# Patient Record
Sex: Male | Born: 1950
Health system: Southern US, Community
[De-identification: ages and names within clinical notes are randomized; demographics above are authoritative.]

## PROBLEM LIST (undated history)

## (undated) DIAGNOSIS — E7800 Pure hypercholesterolemia, unspecified: Secondary | ICD-10-CM

## (undated) DIAGNOSIS — I499 Cardiac arrhythmia, unspecified: Secondary | ICD-10-CM

## (undated) DIAGNOSIS — G4730 Sleep apnea, unspecified: Secondary | ICD-10-CM

## (undated) DIAGNOSIS — I2510 Atherosclerotic heart disease of native coronary artery without angina pectoris: Secondary | ICD-10-CM

## (undated) DIAGNOSIS — I10 Essential (primary) hypertension: Secondary | ICD-10-CM

## (undated) DIAGNOSIS — I219 Acute myocardial infarction, unspecified: Secondary | ICD-10-CM

## (undated) DIAGNOSIS — K56609 Unspecified intestinal obstruction, unspecified as to partial versus complete obstruction: Secondary | ICD-10-CM

## (undated) HISTORY — PX: LAPAROSCOPIC SMALL BOWEL RESECTION: SUR793

---

## 2002-06-29 DIAGNOSIS — I2510 Atherosclerotic heart disease of native coronary artery without angina pectoris: Secondary | ICD-10-CM

## 2002-06-29 HISTORY — DX: Atherosclerotic heart disease of native coronary artery without angina pectoris: I25.10

## 2002-06-29 HISTORY — PX: CORONARY STENT PLACEMENT: SHX1402

## 2005-10-13 ENCOUNTER — Ambulatory Visit: Payer: Self-pay | Admitting: Gastroenterology

## 2009-01-22 ENCOUNTER — Ambulatory Visit: Payer: Self-pay | Admitting: Urology

## 2009-01-24 ENCOUNTER — Ambulatory Visit: Payer: Self-pay | Admitting: Urology

## 2011-11-09 ENCOUNTER — Emergency Department: Payer: Self-pay | Admitting: Emergency Medicine

## 2011-11-09 LAB — COMPREHENSIVE METABOLIC PANEL
Anion Gap: 9 (ref 7–16)
BUN: 10 mg/dL (ref 7–18)
Calcium, Total: 8.3 mg/dL — ABNORMAL LOW (ref 8.5–10.1)
Chloride: 102 mmol/L (ref 98–107)
Co2: 27 mmol/L (ref 21–32)
Creatinine: 0.87 mg/dL (ref 0.60–1.30)
EGFR (African American): >60
EGFR (Non-African Amer.): >60
Glucose: 91 mg/dL (ref 65–99)
Osmolality: 274 (ref 275–301)
Potassium: 4 mmol/L (ref 3.5–5.1)
SGOT(AST): 39 U/L — ABNORMAL HIGH (ref 15–37)
SGPT (ALT): 48 U/L
Total Protein: 7.7 g/dL (ref 6.4–8.2)

## 2011-11-09 LAB — CBC
HCT: 45.8 % (ref 40.0–52.0)
HGB: 15.3 g/dL (ref 13.0–18.0)
MCH: 30.2 pg (ref 26.0–34.0)
MCHC: 33.5 g/dL (ref 32.0–36.0)
MCV: 90 fL (ref 80–100)
Platelet: 223 10*3/uL (ref 150–440)
WBC: 6.7 10*3/uL (ref 3.8–10.6)

## 2011-11-09 LAB — URINALYSIS, COMPLETE
Bacteria: NONE SEEN
Blood: NEGATIVE
Leukocyte Esterase: NEGATIVE
Nitrite: NEGATIVE
Ph: 6 (ref 4.5–8.0)
Specific Gravity: 1.005 (ref 1.003–1.030)
Squamous Epithelial: NONE SEEN

## 2011-11-09 LAB — LIPASE, BLOOD: Lipase: 136 U/L (ref 73–393)

## 2013-11-27 HISTORY — PX: LITHOTRIPSY: SUR834

## 2013-12-06 ENCOUNTER — Ambulatory Visit: Payer: Self-pay | Admitting: Urology

## 2013-12-07 ENCOUNTER — Ambulatory Visit: Payer: Self-pay | Admitting: Urology

## 2014-04-15 ENCOUNTER — Emergency Department: Payer: Self-pay | Admitting: Emergency Medicine

## 2015-06-05 ENCOUNTER — Encounter
Admission: RE | Admit: 2015-06-05 | Discharge: 2015-06-05 | Disposition: A | Discharge status: Home / Self Care | Payer: BC Managed Care – PPO | Source: Ambulatory Visit | Attending: Urology | Admitting: Urology

## 2015-06-05 ENCOUNTER — Encounter: Payer: Self-pay | Admitting: *Deleted

## 2015-06-05 DIAGNOSIS — I2510 Atherosclerotic heart disease of native coronary artery without angina pectoris: Secondary | ICD-10-CM | POA: Diagnosis not present

## 2015-06-05 DIAGNOSIS — Z7982 Long term (current) use of aspirin: Secondary | ICD-10-CM | POA: Diagnosis not present

## 2015-06-05 DIAGNOSIS — Z79899 Other long term (current) drug therapy: Secondary | ICD-10-CM | POA: Diagnosis not present

## 2015-06-05 DIAGNOSIS — N200 Calculus of kidney: Secondary | ICD-10-CM | POA: Diagnosis not present

## 2015-06-05 DIAGNOSIS — G4730 Sleep apnea, unspecified: Secondary | ICD-10-CM | POA: Diagnosis not present

## 2015-06-05 DIAGNOSIS — Z87891 Personal history of nicotine dependence: Secondary | ICD-10-CM | POA: Diagnosis not present

## 2015-06-05 DIAGNOSIS — Z7902 Long term (current) use of antithrombotics/antiplatelets: Secondary | ICD-10-CM | POA: Diagnosis not present

## 2015-06-05 DIAGNOSIS — I10 Essential (primary) hypertension: Secondary | ICD-10-CM | POA: Diagnosis not present

## 2015-06-05 DIAGNOSIS — I252 Old myocardial infarction: Secondary | ICD-10-CM | POA: Diagnosis not present

## 2015-06-05 DIAGNOSIS — Z955 Presence of coronary angioplasty implant and graft: Secondary | ICD-10-CM | POA: Diagnosis not present

## 2015-06-05 DIAGNOSIS — I499 Cardiac arrhythmia, unspecified: Secondary | ICD-10-CM | POA: Diagnosis not present

## 2015-06-06 ENCOUNTER — Ambulatory Visit
Admission: RE | Admit: 2015-06-06 | Discharge: 2015-06-06 | Disposition: A | Discharge status: Home / Self Care | Payer: BC Managed Care – PPO | Source: Ambulatory Visit | Attending: Urology | Admitting: Urology

## 2015-06-06 ENCOUNTER — Encounter
Admission: RE | Disposition: A | Discharge status: Home / Self Care | Payer: Self-pay | Source: Ambulatory Visit | Attending: Urology

## 2015-06-06 ENCOUNTER — Encounter: Payer: Self-pay | Admitting: *Deleted

## 2015-06-06 DIAGNOSIS — N200 Calculus of kidney: Secondary | ICD-10-CM

## 2015-06-06 DIAGNOSIS — Z87891 Personal history of nicotine dependence: Secondary | ICD-10-CM | POA: Insufficient documentation

## 2015-06-06 DIAGNOSIS — Z7902 Long term (current) use of antithrombotics/antiplatelets: Secondary | ICD-10-CM | POA: Insufficient documentation

## 2015-06-06 DIAGNOSIS — I10 Essential (primary) hypertension: Secondary | ICD-10-CM | POA: Insufficient documentation

## 2015-06-06 DIAGNOSIS — Z79899 Other long term (current) drug therapy: Secondary | ICD-10-CM | POA: Insufficient documentation

## 2015-06-06 DIAGNOSIS — Z955 Presence of coronary angioplasty implant and graft: Secondary | ICD-10-CM | POA: Insufficient documentation

## 2015-06-06 DIAGNOSIS — I499 Cardiac arrhythmia, unspecified: Secondary | ICD-10-CM | POA: Insufficient documentation

## 2015-06-06 DIAGNOSIS — G4730 Sleep apnea, unspecified: Secondary | ICD-10-CM | POA: Insufficient documentation

## 2015-06-06 DIAGNOSIS — I2510 Atherosclerotic heart disease of native coronary artery without angina pectoris: Secondary | ICD-10-CM | POA: Insufficient documentation

## 2015-06-06 DIAGNOSIS — Z7982 Long term (current) use of aspirin: Secondary | ICD-10-CM | POA: Insufficient documentation

## 2015-06-06 DIAGNOSIS — I252 Old myocardial infarction: Secondary | ICD-10-CM | POA: Insufficient documentation

## 2015-06-06 HISTORY — DX: Acute myocardial infarction, unspecified: I21.9

## 2015-06-06 HISTORY — DX: Cardiac arrhythmia, unspecified: I49.9

## 2015-06-06 HISTORY — PX: EXTRACORPOREAL SHOCK WAVE LITHOTRIPSY: SHX1557

## 2015-06-06 HISTORY — DX: Essential (primary) hypertension: I10

## 2015-06-06 HISTORY — DX: Atherosclerotic heart disease of native coronary artery without angina pectoris: I25.10

## 2015-06-06 HISTORY — DX: Sleep apnea, unspecified: G47.30

## 2015-06-06 HISTORY — DX: Pure hypercholesterolemia, unspecified: E78.00

## 2015-06-06 SURGERY — LITHOTRIPSY, ESWL
Anesthesia: Moderate Sedation | Laterality: Right

## 2015-06-06 MED ORDER — MORPHINE SULFATE (PF) 10 MG/ML IV SOLN
10.0000 mg | Freq: Once | INTRAVENOUS | Status: AC
  Administered 2015-06-06: 10 mg via INTRAMUSCULAR

## 2015-06-06 MED ORDER — TAMSULOSIN HCL 0.4 MG PO CAPS: 0.4000 mg | ORAL_CAPSULE | Freq: Every day | ORAL | Status: DC

## 2015-06-06 MED ORDER — LEVOFLOXACIN 500 MG PO TABS
500.0000 mg | ORAL_TABLET | ORAL | Status: AC
  Administered 2015-06-06: 500 mg via ORAL

## 2015-06-06 MED ORDER — MIDAZOLAM HCL 2 MG/2ML IJ SOLN
1.0000 mg | Freq: Once | INTRAMUSCULAR | Status: AC
  Administered 2015-06-06: 1 mg via INTRAMUSCULAR

## 2015-06-06 MED ORDER — MIDAZOLAM HCL 2 MG/2ML IJ SOLN
INTRAMUSCULAR | Status: AC
Start: 2015-06-06 — End: 2015-06-06
  Administered 2015-06-06: 1 mg via INTRAMUSCULAR
  Filled 2015-06-06: qty 2

## 2015-06-06 MED ORDER — FUROSEMIDE 10 MG/ML IJ SOLN
10.0000 mg | Freq: Once | INTRAMUSCULAR | Status: AC
  Administered 2015-06-06: 10 mg via INTRAVENOUS

## 2015-06-06 MED ORDER — DIPHENHYDRAMINE HCL 25 MG PO CAPS
ORAL_CAPSULE | ORAL | Status: AC
  Administered 2015-06-06: 25 mg via ORAL
  Filled 2015-06-06: qty 1

## 2015-06-06 MED ORDER — PROMETHAZINE HCL 25 MG/ML IJ SOLN
25.0000 mg | Freq: Once | INTRAMUSCULAR | Status: AC
  Administered 2015-06-06: 25 mg via INTRAMUSCULAR

## 2015-06-06 MED ORDER — PROMETHAZINE HCL 25 MG/ML IJ SOLN: 25.0000 mg | Freq: Once | INTRAMUSCULAR | Status: DC

## 2015-06-06 MED ORDER — LEVOFLOXACIN 500 MG PO TABS
ORAL_TABLET | ORAL | Status: AC
  Administered 2015-06-06: 500 mg via ORAL
  Filled 2015-06-06: qty 1

## 2015-06-06 MED ORDER — NUCYNTA 50 MG PO TABS: 50.0000 mg | ORAL_TABLET | Freq: Four times a day (QID) | ORAL | Status: DC | PRN

## 2015-06-06 MED ORDER — DEXTROSE-NACL 5-0.45 % IV SOLN
INTRAVENOUS | Status: DC
  Administered 2015-06-06: 12:00:00 via INTRAVENOUS

## 2015-06-06 MED ORDER — PROMETHAZINE HCL 25 MG/ML IJ SOLN
INTRAMUSCULAR | Status: AC
  Administered 2015-06-06: 25 mg via INTRAMUSCULAR
  Filled 2015-06-06: qty 1

## 2015-06-06 MED ORDER — DOCUSATE SODIUM 100 MG PO CAPS: 200.0000 mg | ORAL_CAPSULE | Freq: Two times a day (BID) | ORAL | Status: DC

## 2015-06-06 MED ORDER — LEVOFLOXACIN 500 MG PO TABS: 500.0000 mg | ORAL_TABLET | Freq: Every day | ORAL | Status: DC

## 2015-06-06 MED ORDER — MORPHINE SULFATE (PF) 10 MG/ML IV SOLN
INTRAVENOUS | Status: AC
  Administered 2015-06-06: 10 mg via INTRAMUSCULAR
  Filled 2015-06-06: qty 1

## 2015-06-06 MED ORDER — FUROSEMIDE 10 MG/ML IJ SOLN
INTRAMUSCULAR | Status: AC
  Filled 2015-06-06: qty 2

## 2015-06-06 MED ORDER — ONDANSETRON 8 MG PO TBDP: 8.0000 mg | ORAL_TABLET | Freq: Four times a day (QID) | ORAL | Status: DC | PRN

## 2015-06-06 MED ORDER — DIPHENHYDRAMINE HCL 25 MG PO CAPS
25.0000 mg | ORAL_CAPSULE | ORAL | Status: AC
  Administered 2015-06-06: 25 mg via ORAL

## 2015-06-06 NOTE — OR Nursing (Signed)
Post op, no incisions, red circle on right flank 6 cm in diameter.

## 2015-06-06 NOTE — OR Nursing (Signed)
Spoke with Dr. Evelene CroonWolff prior to pt discharge, pt instructed to restart Plavix tomorrow, if pt has an increase in bleeding with urination, pt is to back off on the Plavix.  Pt is not to restart aspirin until he is seen in Dr. Amada JupiterWolff's office on 06/13/15 for follow up.

## 2015-06-06 NOTE — OR Nursing (Signed)
Pt voiced he needed to use the toilet upon arrival to Post-op.  Voided 500 ml blood colored urine,  No traces of stone seen in strainer.  Pt instructed to strain urine and put pieces of stone in a cup and take to Dr. Amada JupiterWolff's office for follow up.

## 2015-06-06 NOTE — Discharge Instructions (Addendum)
Dietary Guidelines to Help Prevent Kidney Stones °Your risk of kidney stones can be decreased by adjusting the foods you eat. The most important thing you can do is drink enough fluid. You should drink enough fluid to keep your urine clear or pale yellow. The following guidelines provide specific information for the type of kidney stone you have had. °GUIDELINES ACCORDING TO TYPE OF KIDNEY STONE °Calcium Oxalate Kidney Stones °· Reduce the amount of salt you eat. Foods that have a lot of salt cause your body to release excess calcium into your urine. The excess calcium can combine with a substance called oxalate to form kidney stones. °· Reduce the amount of animal protein you eat if the amount you eat is excessive. Animal protein causes your body to release excess calcium into your urine. Ask your dietitian how much protein from animal sources you should be eating. °· Avoid foods that are high in oxalates. If you take vitamins, they should have less than 500 mg of vitamin C. Your body turns vitamin C into oxalates. You do not need to avoid fruits and vegetables high in vitamin C. °Calcium Phosphate Kidney Stones °· Reduce the amount of salt you eat to help prevent the release of excess calcium into your urine. °· Reduce the amount of animal protein you eat if the amount you eat is excessive. Animal protein causes your body to release excess calcium into your urine. Ask your dietitian how much protein from animal sources you should be eating. °· Get enough calcium from food or take a calcium supplement (ask your dietitian for recommendations). Food sources of calcium that do not increase your risk of kidney stones include: °· Broccoli. °· Dairy products, such as cheese and yogurt. °· Pudding. °Uric Acid Kidney Stones °· Do not have more than 6 oz of animal protein per day. °FOOD SOURCES °Animal Protein Sources °· Meat (all types). °· Poultry. °· Eggs. °· Fish, seafood. °Foods High in Salt °· Salt seasonings. °· Soy  sauce. °· Teriyaki sauce. °· Cured and processed meats. °· Salted crackers and snack foods. °· Fast food. °· Canned soups and most canned foods. °Foods High in Oxalates °· Grains: °· Amaranth. °· Barley. °· Grits. °· Wheat germ. °· Bran. °· Buckwheat flour. °· All bran cereals. °· Pretzels. °· Whole wheat bread. °· Vegetables: °· Beans (wax). °· Beets and beet greens. °· Collard greens. °· Eggplant. °· Escarole. °· Leeks. °· Okra. °· Parsley. °· Rutabagas. °· Spinach. °· Swiss chard. °· Tomato paste. °· Fried potatoes. °· Sweet potatoes. °· Fruits: °· Red currants. °· Figs. °· Kiwi. °· Rhubarb. °· Meat and Other Protein Sources: °· Beans (dried). °· Soy burgers and other soybean products. °· Miso. °· Nuts (peanuts, almonds, pecans, cashews, hazelnuts). °· Nut butters. °· Sesame seeds and tahini (paste made of sesame seeds). °· Poppy seeds. °· Beverages: °· Chocolate drink mixes. °· Soy milk. °· Instant iced tea. °· Juices made from high-oxalate fruits or vegetables. °· Other: °· Carob. °· Chocolate. °· Fruitcake. °· Marmalades. °  °This information is not intended to replace advice given to you by your health care provider. Make sure you discuss any questions you have with your health care provider. °  °Document Released: 10/10/2010 Document Revised: 06/20/2013 Document Reviewed: 05/12/2013 °Elsevier Interactive Patient Education ©2016 Elsevier Inc. ° °Kidney Stones °Kidney stones (urolithiasis) are deposits that form inside your kidneys. The intense pain is caused by the stone moving through the urinary tract. When the stone moves, the ureter   goes into spasm around the stone. The stone is usually passed in the urine.  °CAUSES  °· A disorder that makes certain neck glands produce too much parathyroid hormone (primary hyperparathyroidism). °· A buildup of uric acid crystals, similar to gout in your joints. °· Narrowing (stricture) of the ureter. °· A kidney obstruction present at birth (congenital  obstruction). °· Previous surgery on the kidney or ureters. °· Numerous kidney infections. °SYMPTOMS  °· Feeling sick to your stomach (nauseous). °· Throwing up (vomiting). °· Blood in the urine (hematuria). °· Pain that usually spreads (radiates) to the groin. °· Frequency or urgency of urination. °DIAGNOSIS  °· Taking a history and physical exam. °· Blood or urine tests. °· CT scan. °· Occasionally, an examination of the inside of the urinary bladder (cystoscopy) is performed. °TREATMENT  °· Observation. °· Increasing your fluid intake. °· Extracorporeal shock wave lithotripsy--This is a noninvasive procedure that uses shock waves to break up kidney stones. °· Surgery may be needed if you have severe pain or persistent obstruction. There are various surgical procedures. Most of the procedures are performed with the use of small instruments. Only small incisions are needed to accommodate these instruments, so recovery time is minimized. °The size, location, and chemical composition are all important variables that will determine the proper choice of action for you. Talk to your health care provider to better understand your situation so that you will minimize the risk of injury to yourself and your kidney.  °HOME CARE INSTRUCTIONS  °· Drink enough water and fluids to keep your urine clear or pale yellow. This will help you to pass the stone or stone fragments. °· Strain all urine through the provided strainer. Keep all particulate matter and stones for your health care provider to see. The stone causing the pain may be as small as a grain of salt. It is very important to use the strainer each and every time you pass your urine. The collection of your stone will allow your health care provider to analyze it and verify that a stone has actually passed. The stone analysis will often identify what you can do to reduce the incidence of recurrences. °· Only take over-the-counter or prescription medicines for pain,  discomfort, or fever as directed by your health care provider. °· Keep all follow-up visits as told by your health care provider. This is important. °· Get follow-up X-rays if required. The absence of pain does not always mean that the stone has passed. It may have only stopped moving. If the urine remains completely obstructed, it can cause loss of kidney function or even complete destruction of the kidney. It is your responsibility to make sure X-rays and follow-ups are completed. Ultrasounds of the kidney can show blockages and the status of the kidney. Ultrasounds are not associated with any radiation and can be performed easily in a matter of minutes. °· Make changes to your daily diet as told by your health care provider. You may be told to: °· Limit the amount of salt that you eat. °· Eat 5 or more servings of fruits and vegetables each day. °· Limit the amount of meat, poultry, fish, and eggs that you eat. °· Collect a 24-hour urine sample as told by your health care provider. You may need to collect another urine sample every 6-12 months. °SEEK MEDICAL CARE IF: °· You experience pain that is progressive and unresponsive to any pain medicine you have been prescribed. °SEEK IMMEDIATE MEDICAL CARE IF:  °· Pain   cannot be controlled with the prescribed medicine. °· You have a fever or shaking chills. °· The severity or intensity of pain increases over 18 hours and is not relieved by pain medicine. °· You develop a new onset of abdominal pain. °· You feel faint or pass out. °· You are unable to urinate. °  °This information is not intended to replace advice given to you by your health care provider. Make sure you discuss any questions you have with your health care provider. °  °Document Released: 06/15/2005 Document Revised: 03/06/2015 Document Reviewed: 11/16/2012 °Elsevier Interactive Patient Education ©2016 Elsevier Inc. ° °Lithotripsy, Care After °Refer to this sheet in the next few weeks. These instructions  provide you with information on caring for yourself after your procedure. Your health care provider may also give you more specific instructions. Your treatment has been planned according to current medical practices, but problems sometimes occur. Call your health care provider if you have any problems or questions after your procedure. °WHAT TO EXPECT AFTER THE PROCEDURE  °· Your urine may have a red tinge for a few days after treatment. Blood loss is usually minimal. °· You may have soreness in the back or flank area. This usually goes away after a few days. The procedure can cause blotches or bruises on the back where the pressure wave enters the skin. These marks usually cause only minimal discomfort and should disappear in a short time. °· Stone fragments should begin to pass within 24 hours of treatment. However, a delayed passage is not unusual. °· You may have pain, discomfort, and feel sick to your stomach (nauseated) when the crushed fragments of stone are passed down the tube from the kidney to the bladder. Stone fragments can pass soon after the procedure and may last for up to 4-8 weeks. °· A small number of patients may have severe pain when stone fragments are not able to pass, which leads to an obstruction. °· If your stone is greater than 1 inch (2.5 cm) in diameter or if you have multiple stones that have a combined diameter greater than 1 inch (2.5 cm), you may require more than one treatment. °· If you had a stent placed prior to your procedure, you may experience some discomfort, especially during urination. You may experience the pain or discomfort in your flank or back, or you may experience a sharp pain or discomfort at the base of your penis or in your lower abdomen. The discomfort usually lasts only a few minutes after urinating. °HOME CARE INSTRUCTIONS  °· Rest at home until you feel your energy improving. °· Only take over-the-counter or prescription medicines for pain, discomfort, or  fever as directed by your health care provider. Depending on the type of lithotripsy, you may need to take antibiotics and anti-inflammatory medicines for a few days. °· Drink enough water and fluids to keep your urine clear or pale yellow. This helps "flush" your kidneys. It helps pass any remaining pieces of stone and prevents stones from coming back. °· Most people can resume daily activities within 1-2 days after standard lithotripsy. It can take longer to recover from laser and percutaneous lithotripsy. °· Strain all urine through the provided strainer. Keep all particulate matter and stones for your health care provider to see. The stone may be as small as a grain of salt. It is very important to use the strainer each and every time you pass your urine. Any stones that are found can be sent to   a medical lab for examination.  Visit your health care provider for a follow-up appointment in a few weeks. Your doctor may remove your stent if you have one. Your health care provider will also check to see whether stone particles still remain. SEEK MEDICAL CARE IF:   Your pain is not relieved by medicine.  You have a lasting nauseous feeling.  You feel there is too much blood in the urine.  You develop persistent problems with frequent or painful urination that does not at least partially improve after 2 days following the procedure.  You have a congested cough.  You feel lightheaded.  You develop a rash or any other signs that might suggest an allergic problem.  You develop any reaction or side effects to your medicine(s). SEEK IMMEDIATE MEDICAL CARE IF:   You experience severe back or flank pain or both.  You see nothing but blood when you urinate.  You cannot pass any urine at all.  You have a fever or shaking chills.  You develop shortness of breath, difficulty breathing, or chest pain.  You develop vomiting that will not stop after 6-8 hours.  You have a fainting episode.   This  information is not intended to replace advice given to you by your health care provider. Make sure you discuss any questions you have with your health care provider.   Document Released: 07/05/2007 Document Revised: 03/06/2015 Document Reviewed: 12/29/2012 Elsevier Interactive Patient Education [employer].  Lithotripsy Lithotripsy is a treatment that can sometimes help eliminate kidney stones and pain that they cause. A form of lithotripsy, also known as extracorporeal shock wave lithotripsy, is a nonsurgical procedure that helps your body rid itself of the kidney stone when it is too big to pass on its own. Extracorporeal shock wave lithotripsy is a method of crushing a kidney stone with shock waves. These shock waves pass through your body and are focused on your stone. They cause the kidney stones to crumble while still in the urinary tract. It is then easier for the smaller pieces of stone to pass in the urine. Lithotripsy usually takes about an hour. It is done in a hospital, a lithotripsy center, or a mobile unit. It usually does not require an overnight stay. Your health care provider will instruct you on preparation for the procedure. Your health care provider will tell you what to expect afterward. LET [facility] CARE PROVIDER KNOW ABOUT:  Any allergies you have.  All medicines you are taking, including vitamins, herbs, eye drops, creams, and over-the-counter medicines.  Previous problems you or members of your family have had with the use of anesthetics.  Any blood disorders you have.  Previous surgeries you have had.  Medical conditions you have. RISKS AND COMPLICATIONS Generally, lithotripsy for kidney stones is a safe procedure. However, as with any procedure, complications can occur. Possible complications include:  Infection.  Bleeding of the kidney.  Bruising of the kidney or skin.  Obstruction of the ureter.  Failure of the stone to fragment. BEFORE THE  PROCEDURE  Do not eat or drink for 6-8 hours prior to the procedure. You may, however, take the medications with a sip of water that your physician instructs you to take  Do not take aspirin or aspirin-containing products for 7 days prior to your procedure  Do not take nonsteroidal anti-inflammatory products for 7 days prior to your procedure PROCEDURE A stent (flexible tube with holes) may be placed in your ureter. The ureter is  the tube that transports the urine from the kidneys to the bladder. Your health care provider may place a stent before the procedure. This will help keep urine flowing from the kidney if the fragments of the stone block the ureter. You may have an IV tube placed in one of your veins to give you fluids and medicines. These medicines may help you relax or make you sleep. During the procedure, you will lie comfortably on a fluid-filled cushion or in a warm-water bath. After an X-ray or ultrasound exam to locate your stone, shock waves are aimed at the stone. If you are awake, you may feel a tapping sensation as the shock waves pass through your body. If large stone particles remain after treatment, a second procedure may be necessary at a later date. °For comfort during the test: °· Relax as much as possible. °· Try to remain still as much as possible. °· Try to follow instructions to speed up the test. °· Let your health care provider know if you are uncomfortable, anxious, or in pain. °AFTER THE PROCEDURE  °After surgery, you will be taken to the recovery area. A nurse will watch and check your progress. Once you're awake, stable, and taking fluids well, you will be allowed to go home as long as there are no problems. You will also be allowed to pass your urine before discharge. You may be given antibiotics to help prevent infection. You may also be prescribed pain medicine if needed. In a week or two, your health care provider may remove your stent, if you have one. You may first  have an X-ray exam to check on how successful the fragmentation of your stone has been and how much of the stone has passed. Your health care provider will check to see whether or not stone particles remain. °SEEK IMMEDIATE MEDICAL CARE IF: °· You develop a fever or shaking chills. °· Your pain is not relieved by medicine. °· You feel sick to your stomach (nauseated) and you vomit. °· You develop heavy bleeding. °· You have difficulty urinating. °· You start to pass your stent from your penis. °  °This information is not intended to replace advice given to you by your health care provider. Make sure you discuss any questions you have with your health care provider. °  °Document Released: 06/12/2000 Document Revised: 07/06/2014 Document Reviewed: 12/29/2012 °Elsevier Interactive Patient Education ©2016 Elsevier Inc. ° °Renal Colic °Renal colic is pain that is caused by passing a kidney stone. The pain can be sharp and severe. It may be felt in the back, abdomen, side (flank), or groin. It can cause nausea. Renal colic can come and go. °HOME CARE INSTRUCTIONS °Watch your condition for any changes. The following actions may help to lessen any discomfort that you are feeling: °· Take medicines only as directed by your health care provider. °· Ask your health care provider if it is okay to take over-the-counter pain medicine. °· Drink enough fluid to keep your urine clear or pale yellow. Drink 6-8 glasses of water each day. °· Limit the amount of salt that you eat to less than 2 grams per day. °· Reduce the amount of protein in your diet. Eat less meat, fish, nuts, and dairy. °· Avoid foods such as spinach, rhubarb, nuts, or bran. These may make kidney stones more likely to form. °SEEK MEDICAL CARE IF: °· You have a fever or chills. °· Your urine smells bad or looks cloudy. °· You have pain or   burning when you pass urine. SEEK IMMEDIATE MEDICAL CARE IF:  Your flank pain or groin pain suddenly worsens.  You become  confused or disoriented or you lose consciousness.   This information is not intended to replace advice given to you by your health care provider. Make sure you discuss any questions you have with your health care provider.   Document Released: 03/25/2005 Document Revised: 07/06/2014 Document Reviewed: 04/25/2014 Elsevier Interactive Patient Education 2016 Elsevier Inc. AMBULATORY SURGERY  DISCHARGE INSTRUCTIONS   1) The drugs that you were given will stay in your system until tomorrow so for the next 24 hours you should not:  A) Drive an automobile B) Make any legal decisions C) Drink any alcoholic beverage   2) You may resume regular meals tomorrow.  Today it is better to start with liquids and gradually work up to solid foods.  You may eat anything you prefer, but it is better to start with liquids, then soup and crackers, and gradually work up to solid foods.   3) Please notify your doctor immediately if you have any unusual bleeding, trouble breathing, redness and pain at the surgery site, drainage, fever, or pain not relieved by medication

## 2016-01-29 DIAGNOSIS — I10 Essential (primary) hypertension: Secondary | ICD-10-CM | POA: Diagnosis not present

## 2016-01-29 DIAGNOSIS — W57XXXA Bitten or stung by nonvenomous insect and other nonvenomous arthropods, initial encounter: Secondary | ICD-10-CM | POA: Diagnosis not present

## 2016-01-29 DIAGNOSIS — N200 Calculus of kidney: Secondary | ICD-10-CM | POA: Diagnosis not present

## 2016-01-29 DIAGNOSIS — I2510 Atherosclerotic heart disease of native coronary artery without angina pectoris: Secondary | ICD-10-CM | POA: Diagnosis not present

## 2016-03-24 DIAGNOSIS — E782 Mixed hyperlipidemia: Secondary | ICD-10-CM | POA: Diagnosis not present

## 2016-03-24 DIAGNOSIS — I10 Essential (primary) hypertension: Secondary | ICD-10-CM | POA: Diagnosis not present

## 2016-03-24 DIAGNOSIS — I2510 Atherosclerotic heart disease of native coronary artery without angina pectoris: Secondary | ICD-10-CM | POA: Diagnosis not present

## 2016-07-24 DIAGNOSIS — I10 Essential (primary) hypertension: Secondary | ICD-10-CM | POA: Diagnosis not present

## 2016-07-24 DIAGNOSIS — I2510 Atherosclerotic heart disease of native coronary artery without angina pectoris: Secondary | ICD-10-CM | POA: Diagnosis not present

## 2016-09-07 DIAGNOSIS — R739 Hyperglycemia, unspecified: Secondary | ICD-10-CM | POA: Diagnosis not present

## 2016-09-07 DIAGNOSIS — I10 Essential (primary) hypertension: Secondary | ICD-10-CM | POA: Diagnosis not present

## 2016-09-07 DIAGNOSIS — I2510 Atherosclerotic heart disease of native coronary artery without angina pectoris: Secondary | ICD-10-CM | POA: Diagnosis not present

## 2016-09-09 DIAGNOSIS — E119 Type 2 diabetes mellitus without complications: Secondary | ICD-10-CM | POA: Diagnosis not present

## 2016-09-09 DIAGNOSIS — I10 Essential (primary) hypertension: Secondary | ICD-10-CM | POA: Diagnosis not present

## 2016-09-09 DIAGNOSIS — R079 Chest pain, unspecified: Secondary | ICD-10-CM | POA: Diagnosis not present

## 2016-09-09 DIAGNOSIS — E782 Mixed hyperlipidemia: Secondary | ICD-10-CM | POA: Diagnosis not present

## 2016-09-09 DIAGNOSIS — I2510 Atherosclerotic heart disease of native coronary artery without angina pectoris: Secondary | ICD-10-CM | POA: Diagnosis not present

## 2016-09-09 DIAGNOSIS — Z23 Encounter for immunization: Secondary | ICD-10-CM | POA: Diagnosis not present

## 2016-12-11 DIAGNOSIS — I10 Essential (primary) hypertension: Secondary | ICD-10-CM | POA: Diagnosis not present

## 2016-12-11 DIAGNOSIS — E119 Type 2 diabetes mellitus without complications: Secondary | ICD-10-CM | POA: Diagnosis not present

## 2017-02-18 DIAGNOSIS — R079 Chest pain, unspecified: Secondary | ICD-10-CM | POA: Diagnosis not present

## 2017-02-18 DIAGNOSIS — I2510 Atherosclerotic heart disease of native coronary artery without angina pectoris: Secondary | ICD-10-CM | POA: Diagnosis not present

## 2017-02-25 DIAGNOSIS — E782 Mixed hyperlipidemia: Secondary | ICD-10-CM | POA: Diagnosis not present

## 2017-02-25 DIAGNOSIS — E119 Type 2 diabetes mellitus without complications: Secondary | ICD-10-CM | POA: Diagnosis not present

## 2017-02-25 DIAGNOSIS — I2510 Atherosclerotic heart disease of native coronary artery without angina pectoris: Secondary | ICD-10-CM | POA: Diagnosis not present

## 2017-02-25 DIAGNOSIS — I10 Essential (primary) hypertension: Secondary | ICD-10-CM | POA: Diagnosis not present

## 2017-03-19 DIAGNOSIS — G4733 Obstructive sleep apnea (adult) (pediatric): Secondary | ICD-10-CM | POA: Diagnosis not present

## 2017-03-19 DIAGNOSIS — E119 Type 2 diabetes mellitus without complications: Secondary | ICD-10-CM | POA: Diagnosis not present

## 2017-03-19 DIAGNOSIS — Z23 Encounter for immunization: Secondary | ICD-10-CM | POA: Diagnosis not present

## 2017-03-19 DIAGNOSIS — I10 Essential (primary) hypertension: Secondary | ICD-10-CM | POA: Diagnosis not present

## 2017-03-19 DIAGNOSIS — A6920 Lyme disease, unspecified: Secondary | ICD-10-CM | POA: Diagnosis not present

## 2017-03-30 DIAGNOSIS — G4733 Obstructive sleep apnea (adult) (pediatric): Secondary | ICD-10-CM | POA: Diagnosis not present

## 2017-07-16 DIAGNOSIS — Z1159 Encounter for screening for other viral diseases: Secondary | ICD-10-CM | POA: Diagnosis not present

## 2017-07-16 DIAGNOSIS — Z125 Encounter for screening for malignant neoplasm of prostate: Secondary | ICD-10-CM | POA: Diagnosis not present

## 2017-07-16 DIAGNOSIS — E782 Mixed hyperlipidemia: Secondary | ICD-10-CM | POA: Diagnosis not present

## 2017-07-16 DIAGNOSIS — E119 Type 2 diabetes mellitus without complications: Secondary | ICD-10-CM | POA: Diagnosis not present

## 2017-07-16 DIAGNOSIS — I10 Essential (primary) hypertension: Secondary | ICD-10-CM | POA: Diagnosis not present

## 2017-07-23 DIAGNOSIS — E782 Mixed hyperlipidemia: Secondary | ICD-10-CM | POA: Diagnosis not present

## 2017-07-23 DIAGNOSIS — E119 Type 2 diabetes mellitus without complications: Secondary | ICD-10-CM | POA: Diagnosis not present

## 2017-07-23 DIAGNOSIS — I10 Essential (primary) hypertension: Secondary | ICD-10-CM | POA: Diagnosis not present

## 2017-08-24 DIAGNOSIS — E782 Mixed hyperlipidemia: Secondary | ICD-10-CM | POA: Diagnosis not present

## 2017-08-24 DIAGNOSIS — I2510 Atherosclerotic heart disease of native coronary artery without angina pectoris: Secondary | ICD-10-CM | POA: Diagnosis not present

## 2017-08-24 DIAGNOSIS — I10 Essential (primary) hypertension: Secondary | ICD-10-CM | POA: Diagnosis not present

## 2017-08-24 DIAGNOSIS — G4733 Obstructive sleep apnea (adult) (pediatric): Secondary | ICD-10-CM | POA: Diagnosis not present

## 2017-11-23 DIAGNOSIS — M25561 Pain in right knee: Secondary | ICD-10-CM | POA: Diagnosis not present

## 2017-11-23 DIAGNOSIS — E119 Type 2 diabetes mellitus without complications: Secondary | ICD-10-CM | POA: Diagnosis not present

## 2017-11-23 DIAGNOSIS — Z87442 Personal history of urinary calculi: Secondary | ICD-10-CM | POA: Diagnosis not present

## 2017-11-23 DIAGNOSIS — M25562 Pain in left knee: Secondary | ICD-10-CM | POA: Diagnosis not present

## 2017-11-23 DIAGNOSIS — I2510 Atherosclerotic heart disease of native coronary artery without angina pectoris: Secondary | ICD-10-CM | POA: Diagnosis not present

## 2017-11-23 DIAGNOSIS — I10 Essential (primary) hypertension: Secondary | ICD-10-CM | POA: Diagnosis not present

## 2017-11-23 DIAGNOSIS — E782 Mixed hyperlipidemia: Secondary | ICD-10-CM | POA: Diagnosis not present

## 2017-11-23 DIAGNOSIS — G8929 Other chronic pain: Secondary | ICD-10-CM | POA: Diagnosis not present

## 2017-12-02 DIAGNOSIS — I10 Essential (primary) hypertension: Secondary | ICD-10-CM | POA: Diagnosis not present

## 2018-02-22 DIAGNOSIS — G4733 Obstructive sleep apnea (adult) (pediatric): Secondary | ICD-10-CM | POA: Diagnosis not present

## 2018-02-22 DIAGNOSIS — E782 Mixed hyperlipidemia: Secondary | ICD-10-CM | POA: Diagnosis not present

## 2018-02-22 DIAGNOSIS — I2510 Atherosclerotic heart disease of native coronary artery without angina pectoris: Secondary | ICD-10-CM | POA: Diagnosis not present

## 2018-02-22 DIAGNOSIS — I10 Essential (primary) hypertension: Secondary | ICD-10-CM | POA: Diagnosis not present

## 2018-02-24 DIAGNOSIS — E119 Type 2 diabetes mellitus without complications: Secondary | ICD-10-CM | POA: Diagnosis not present

## 2018-02-24 DIAGNOSIS — Z23 Encounter for immunization: Secondary | ICD-10-CM | POA: Diagnosis not present

## 2018-02-24 DIAGNOSIS — G4733 Obstructive sleep apnea (adult) (pediatric): Secondary | ICD-10-CM | POA: Diagnosis not present

## 2018-02-24 DIAGNOSIS — E782 Mixed hyperlipidemia: Secondary | ICD-10-CM | POA: Diagnosis not present

## 2018-02-24 DIAGNOSIS — I10 Essential (primary) hypertension: Secondary | ICD-10-CM | POA: Diagnosis not present

## 2018-03-01 DIAGNOSIS — G4733 Obstructive sleep apnea (adult) (pediatric): Secondary | ICD-10-CM | POA: Diagnosis not present

## 2018-06-02 DIAGNOSIS — E782 Mixed hyperlipidemia: Secondary | ICD-10-CM | POA: Diagnosis not present

## 2018-06-02 DIAGNOSIS — I2510 Atherosclerotic heart disease of native coronary artery without angina pectoris: Secondary | ICD-10-CM | POA: Diagnosis not present

## 2018-06-02 DIAGNOSIS — L989 Disorder of the skin and subcutaneous tissue, unspecified: Secondary | ICD-10-CM | POA: Diagnosis not present

## 2018-06-02 DIAGNOSIS — E119 Type 2 diabetes mellitus without complications: Secondary | ICD-10-CM | POA: Diagnosis not present

## 2018-06-02 DIAGNOSIS — I10 Essential (primary) hypertension: Secondary | ICD-10-CM | POA: Diagnosis not present

## 2018-08-23 DIAGNOSIS — E119 Type 2 diabetes mellitus without complications: Secondary | ICD-10-CM | POA: Diagnosis not present

## 2018-08-23 DIAGNOSIS — I2510 Atherosclerotic heart disease of native coronary artery without angina pectoris: Secondary | ICD-10-CM | POA: Diagnosis not present

## 2018-08-23 DIAGNOSIS — E782 Mixed hyperlipidemia: Secondary | ICD-10-CM | POA: Diagnosis not present

## 2018-08-23 DIAGNOSIS — I10 Essential (primary) hypertension: Secondary | ICD-10-CM | POA: Diagnosis not present

## 2018-08-23 DIAGNOSIS — G4733 Obstructive sleep apnea (adult) (pediatric): Secondary | ICD-10-CM | POA: Diagnosis not present

## 2018-12-06 DIAGNOSIS — Z136 Encounter for screening for cardiovascular disorders: Secondary | ICD-10-CM | POA: Diagnosis not present

## 2018-12-06 DIAGNOSIS — E119 Type 2 diabetes mellitus without complications: Secondary | ICD-10-CM | POA: Diagnosis not present

## 2018-12-06 DIAGNOSIS — I10 Essential (primary) hypertension: Secondary | ICD-10-CM | POA: Diagnosis not present

## 2018-12-06 DIAGNOSIS — E782 Mixed hyperlipidemia: Secondary | ICD-10-CM | POA: Diagnosis not present

## 2018-12-06 DIAGNOSIS — Z636 Dependent relative needing care at home: Secondary | ICD-10-CM | POA: Diagnosis not present

## 2018-12-13 ENCOUNTER — Other Ambulatory Visit: Payer: Self-pay | Admitting: Pediatrics

## 2018-12-13 DIAGNOSIS — I10 Essential (primary) hypertension: Secondary | ICD-10-CM

## 2018-12-22 ENCOUNTER — Ambulatory Visit: Payer: PPO

## 2018-12-23 ENCOUNTER — Ambulatory Visit: Admission: RE | Admit: 2018-12-23 | Payer: PPO | Source: Ambulatory Visit

## 2019-01-30 ENCOUNTER — Other Ambulatory Visit: Payer: Self-pay

## 2019-02-21 DIAGNOSIS — E782 Mixed hyperlipidemia: Secondary | ICD-10-CM | POA: Diagnosis not present

## 2019-02-21 DIAGNOSIS — I2510 Atherosclerotic heart disease of native coronary artery without angina pectoris: Secondary | ICD-10-CM | POA: Diagnosis not present

## 2019-02-21 DIAGNOSIS — G4733 Obstructive sleep apnea (adult) (pediatric): Secondary | ICD-10-CM | POA: Diagnosis not present

## 2019-02-21 DIAGNOSIS — I10 Essential (primary) hypertension: Secondary | ICD-10-CM | POA: Diagnosis not present

## 2019-02-21 DIAGNOSIS — E119 Type 2 diabetes mellitus without complications: Secondary | ICD-10-CM | POA: Diagnosis not present

## 2019-02-24 ENCOUNTER — Other Ambulatory Visit: Payer: Self-pay | Admitting: Pediatrics

## 2019-02-24 DIAGNOSIS — Z87891 Personal history of nicotine dependence: Secondary | ICD-10-CM

## 2019-02-24 DIAGNOSIS — I10 Essential (primary) hypertension: Secondary | ICD-10-CM

## 2019-02-27 ENCOUNTER — Other Ambulatory Visit: Payer: Self-pay

## 2019-02-27 ENCOUNTER — Ambulatory Visit
Admission: RE | Admit: 2019-02-27 | Discharge: 2019-02-27 | Disposition: A | Discharge status: Home / Self Care | Payer: PPO | Source: Ambulatory Visit | Attending: Pediatrics | Admitting: Pediatrics

## 2019-02-27 DIAGNOSIS — I10 Essential (primary) hypertension: Secondary | ICD-10-CM

## 2019-02-27 DIAGNOSIS — Z87891 Personal history of nicotine dependence: Secondary | ICD-10-CM | POA: Diagnosis not present

## 2019-02-27 DIAGNOSIS — Z136 Encounter for screening for cardiovascular disorders: Secondary | ICD-10-CM | POA: Diagnosis not present

## 2019-03-09 DIAGNOSIS — Z1211 Encounter for screening for malignant neoplasm of colon: Secondary | ICD-10-CM | POA: Diagnosis not present

## 2019-03-09 DIAGNOSIS — Z23 Encounter for immunization: Secondary | ICD-10-CM | POA: Diagnosis not present

## 2019-03-09 DIAGNOSIS — Z0000 Encounter for general adult medical examination without abnormal findings: Secondary | ICD-10-CM | POA: Diagnosis not present

## 2019-03-09 DIAGNOSIS — E782 Mixed hyperlipidemia: Secondary | ICD-10-CM | POA: Diagnosis not present

## 2019-03-28 DIAGNOSIS — E119 Type 2 diabetes mellitus without complications: Secondary | ICD-10-CM | POA: Diagnosis not present

## 2019-03-28 DIAGNOSIS — Z9989 Dependence on other enabling machines and devices: Secondary | ICD-10-CM | POA: Diagnosis not present

## 2019-03-28 DIAGNOSIS — Z1211 Encounter for screening for malignant neoplasm of colon: Secondary | ICD-10-CM | POA: Diagnosis not present

## 2019-03-28 DIAGNOSIS — E782 Mixed hyperlipidemia: Secondary | ICD-10-CM | POA: Diagnosis not present

## 2019-03-28 DIAGNOSIS — G4733 Obstructive sleep apnea (adult) (pediatric): Secondary | ICD-10-CM | POA: Diagnosis not present

## 2019-03-28 DIAGNOSIS — I252 Old myocardial infarction: Secondary | ICD-10-CM | POA: Diagnosis not present

## 2019-03-28 DIAGNOSIS — I2510 Atherosclerotic heart disease of native coronary artery without angina pectoris: Secondary | ICD-10-CM | POA: Diagnosis not present

## 2019-06-08 DIAGNOSIS — I2510 Atherosclerotic heart disease of native coronary artery without angina pectoris: Secondary | ICD-10-CM | POA: Diagnosis not present

## 2019-06-08 DIAGNOSIS — I10 Essential (primary) hypertension: Secondary | ICD-10-CM | POA: Diagnosis not present

## 2019-06-08 DIAGNOSIS — E119 Type 2 diabetes mellitus without complications: Secondary | ICD-10-CM | POA: Diagnosis not present

## 2019-06-08 DIAGNOSIS — Z636 Dependent relative needing care at home: Secondary | ICD-10-CM | POA: Diagnosis not present

## 2019-06-08 DIAGNOSIS — E782 Mixed hyperlipidemia: Secondary | ICD-10-CM | POA: Diagnosis not present

## 2019-07-10 ENCOUNTER — Encounter: Admission: RE | Payer: Self-pay | Source: Home / Self Care

## 2019-07-10 ENCOUNTER — Ambulatory Visit: Admission: RE | Admit: 2019-07-10 | Payer: PPO | Source: Home / Self Care | Admitting: Internal Medicine

## 2019-07-10 SURGERY — COLONOSCOPY WITH PROPOFOL
Anesthesia: General

## 2019-08-22 DIAGNOSIS — E119 Type 2 diabetes mellitus without complications: Secondary | ICD-10-CM | POA: Diagnosis not present

## 2019-08-22 DIAGNOSIS — I214 Non-ST elevation (NSTEMI) myocardial infarction: Secondary | ICD-10-CM | POA: Diagnosis not present

## 2019-08-22 DIAGNOSIS — I2510 Atherosclerotic heart disease of native coronary artery without angina pectoris: Secondary | ICD-10-CM | POA: Diagnosis not present

## 2019-08-22 DIAGNOSIS — E782 Mixed hyperlipidemia: Secondary | ICD-10-CM | POA: Diagnosis not present

## 2019-08-22 DIAGNOSIS — I10 Essential (primary) hypertension: Secondary | ICD-10-CM | POA: Diagnosis not present

## 2019-08-22 DIAGNOSIS — G4733 Obstructive sleep apnea (adult) (pediatric): Secondary | ICD-10-CM | POA: Diagnosis not present

## 2019-09-06 DIAGNOSIS — E119 Type 2 diabetes mellitus without complications: Secondary | ICD-10-CM | POA: Diagnosis not present

## 2019-09-06 DIAGNOSIS — I10 Essential (primary) hypertension: Secondary | ICD-10-CM | POA: Diagnosis not present

## 2019-09-06 DIAGNOSIS — E782 Mixed hyperlipidemia: Secondary | ICD-10-CM | POA: Diagnosis not present

## 2019-12-13 DIAGNOSIS — L219 Seborrheic dermatitis, unspecified: Secondary | ICD-10-CM | POA: Diagnosis not present

## 2019-12-13 DIAGNOSIS — Z125 Encounter for screening for malignant neoplasm of prostate: Secondary | ICD-10-CM | POA: Diagnosis not present

## 2019-12-13 DIAGNOSIS — E782 Mixed hyperlipidemia: Secondary | ICD-10-CM | POA: Diagnosis not present

## 2019-12-13 DIAGNOSIS — I10 Essential (primary) hypertension: Secondary | ICD-10-CM | POA: Diagnosis not present

## 2019-12-13 DIAGNOSIS — H543 Unqualified visual loss, both eyes: Secondary | ICD-10-CM | POA: Diagnosis not present

## 2019-12-13 DIAGNOSIS — Z1211 Encounter for screening for malignant neoplasm of colon: Secondary | ICD-10-CM | POA: Diagnosis not present

## 2019-12-13 DIAGNOSIS — H02403 Unspecified ptosis of bilateral eyelids: Secondary | ICD-10-CM | POA: Diagnosis not present

## 2019-12-13 DIAGNOSIS — E119 Type 2 diabetes mellitus without complications: Secondary | ICD-10-CM | POA: Diagnosis not present

## 2019-12-26 DIAGNOSIS — H029 Unspecified disorder of eyelid: Secondary | ICD-10-CM | POA: Diagnosis not present

## 2019-12-26 DIAGNOSIS — H02834 Dermatochalasis of left upper eyelid: Secondary | ICD-10-CM | POA: Diagnosis not present

## 2019-12-26 DIAGNOSIS — H02831 Dermatochalasis of right upper eyelid: Secondary | ICD-10-CM | POA: Diagnosis not present

## 2020-01-05 DIAGNOSIS — L570 Actinic keratosis: Secondary | ICD-10-CM | POA: Diagnosis not present

## 2020-01-05 DIAGNOSIS — L578 Other skin changes due to chronic exposure to nonionizing radiation: Secondary | ICD-10-CM | POA: Diagnosis not present

## 2020-01-23 DIAGNOSIS — L570 Actinic keratosis: Secondary | ICD-10-CM | POA: Diagnosis not present

## 2020-02-20 DIAGNOSIS — E782 Mixed hyperlipidemia: Secondary | ICD-10-CM | POA: Diagnosis not present

## 2020-02-20 DIAGNOSIS — G4733 Obstructive sleep apnea (adult) (pediatric): Secondary | ICD-10-CM | POA: Diagnosis not present

## 2020-02-20 DIAGNOSIS — I2510 Atherosclerotic heart disease of native coronary artery without angina pectoris: Secondary | ICD-10-CM | POA: Diagnosis not present

## 2020-02-20 DIAGNOSIS — I214 Non-ST elevation (NSTEMI) myocardial infarction: Secondary | ICD-10-CM | POA: Diagnosis not present

## 2020-02-20 DIAGNOSIS — E119 Type 2 diabetes mellitus without complications: Secondary | ICD-10-CM | POA: Diagnosis not present

## 2020-02-20 DIAGNOSIS — I10 Essential (primary) hypertension: Secondary | ICD-10-CM | POA: Diagnosis not present

## 2020-03-07 DIAGNOSIS — Z955 Presence of coronary angioplasty implant and graft: Secondary | ICD-10-CM | POA: Diagnosis not present

## 2020-03-07 DIAGNOSIS — Z91018 Allergy to other foods: Secondary | ICD-10-CM | POA: Diagnosis not present

## 2020-03-07 DIAGNOSIS — Z8673 Personal history of transient ischemic attack (TIA), and cerebral infarction without residual deficits: Secondary | ICD-10-CM | POA: Diagnosis not present

## 2020-03-07 DIAGNOSIS — E119 Type 2 diabetes mellitus without complications: Secondary | ICD-10-CM | POA: Diagnosis not present

## 2020-03-07 DIAGNOSIS — G4730 Sleep apnea, unspecified: Secondary | ICD-10-CM | POA: Diagnosis not present

## 2020-03-07 DIAGNOSIS — Z7984 Long term (current) use of oral hypoglycemic drugs: Secondary | ICD-10-CM | POA: Diagnosis not present

## 2020-03-07 DIAGNOSIS — Z79899 Other long term (current) drug therapy: Secondary | ICD-10-CM | POA: Diagnosis not present

## 2020-03-07 DIAGNOSIS — M1990 Unspecified osteoarthritis, unspecified site: Secondary | ICD-10-CM | POA: Diagnosis not present

## 2020-03-07 DIAGNOSIS — L821 Other seborrheic keratosis: Secondary | ICD-10-CM | POA: Diagnosis not present

## 2020-03-07 DIAGNOSIS — H029 Unspecified disorder of eyelid: Secondary | ICD-10-CM | POA: Diagnosis not present

## 2020-03-07 DIAGNOSIS — L570 Actinic keratosis: Secondary | ICD-10-CM | POA: Diagnosis not present

## 2020-03-07 DIAGNOSIS — Z87891 Personal history of nicotine dependence: Secondary | ICD-10-CM | POA: Diagnosis not present

## 2020-03-07 DIAGNOSIS — I2510 Atherosclerotic heart disease of native coronary artery without angina pectoris: Secondary | ICD-10-CM | POA: Diagnosis not present

## 2020-03-07 DIAGNOSIS — Z85828 Personal history of other malignant neoplasm of skin: Secondary | ICD-10-CM | POA: Diagnosis not present

## 2020-03-07 DIAGNOSIS — Z91030 Bee allergy status: Secondary | ICD-10-CM | POA: Diagnosis not present

## 2020-03-07 DIAGNOSIS — J449 Chronic obstructive pulmonary disease, unspecified: Secondary | ICD-10-CM | POA: Diagnosis not present

## 2020-03-07 DIAGNOSIS — K219 Gastro-esophageal reflux disease without esophagitis: Secondary | ICD-10-CM | POA: Diagnosis not present

## 2020-03-07 DIAGNOSIS — H02834 Dermatochalasis of left upper eyelid: Secondary | ICD-10-CM | POA: Diagnosis not present

## 2020-03-07 DIAGNOSIS — Z7982 Long term (current) use of aspirin: Secondary | ICD-10-CM | POA: Diagnosis not present

## 2020-03-07 DIAGNOSIS — H02831 Dermatochalasis of right upper eyelid: Secondary | ICD-10-CM | POA: Diagnosis not present

## 2020-03-07 DIAGNOSIS — Z951 Presence of aortocoronary bypass graft: Secondary | ICD-10-CM | POA: Diagnosis not present

## 2020-03-14 DIAGNOSIS — E119 Type 2 diabetes mellitus without complications: Secondary | ICD-10-CM | POA: Diagnosis not present

## 2020-03-14 DIAGNOSIS — Z23 Encounter for immunization: Secondary | ICD-10-CM | POA: Diagnosis not present

## 2020-03-14 DIAGNOSIS — I10 Essential (primary) hypertension: Secondary | ICD-10-CM | POA: Diagnosis not present

## 2020-03-14 DIAGNOSIS — Z0000 Encounter for general adult medical examination without abnormal findings: Secondary | ICD-10-CM | POA: Diagnosis not present

## 2020-03-14 DIAGNOSIS — Z77090 Contact with and (suspected) exposure to asbestos: Secondary | ICD-10-CM | POA: Diagnosis not present

## 2020-03-14 DIAGNOSIS — Z87891 Personal history of nicotine dependence: Secondary | ICD-10-CM | POA: Diagnosis not present

## 2020-05-07 DIAGNOSIS — L570 Actinic keratosis: Secondary | ICD-10-CM | POA: Diagnosis not present

## 2020-05-07 DIAGNOSIS — L821 Other seborrheic keratosis: Secondary | ICD-10-CM | POA: Diagnosis not present

## 2020-05-07 DIAGNOSIS — L918 Other hypertrophic disorders of the skin: Secondary | ICD-10-CM | POA: Diagnosis not present

## 2020-05-07 DIAGNOSIS — Z808 Family history of malignant neoplasm of other organs or systems: Secondary | ICD-10-CM | POA: Diagnosis not present

## 2020-05-07 DIAGNOSIS — L578 Other skin changes due to chronic exposure to nonionizing radiation: Secondary | ICD-10-CM | POA: Diagnosis not present

## 2020-05-07 DIAGNOSIS — Z872 Personal history of diseases of the skin and subcutaneous tissue: Secondary | ICD-10-CM | POA: Diagnosis not present

## 2020-08-05 DIAGNOSIS — G4733 Obstructive sleep apnea (adult) (pediatric): Secondary | ICD-10-CM | POA: Diagnosis not present

## 2020-08-05 DIAGNOSIS — I10 Essential (primary) hypertension: Secondary | ICD-10-CM | POA: Diagnosis not present

## 2020-08-05 DIAGNOSIS — I2510 Atherosclerotic heart disease of native coronary artery without angina pectoris: Secondary | ICD-10-CM | POA: Diagnosis not present

## 2020-08-05 DIAGNOSIS — I214 Non-ST elevation (NSTEMI) myocardial infarction: Secondary | ICD-10-CM | POA: Diagnosis not present

## 2020-08-05 DIAGNOSIS — E119 Type 2 diabetes mellitus without complications: Secondary | ICD-10-CM | POA: Diagnosis not present

## 2020-08-05 DIAGNOSIS — E782 Mixed hyperlipidemia: Secondary | ICD-10-CM | POA: Diagnosis not present

## 2020-09-12 DIAGNOSIS — G4733 Obstructive sleep apnea (adult) (pediatric): Secondary | ICD-10-CM | POA: Diagnosis not present

## 2020-09-12 DIAGNOSIS — E119 Type 2 diabetes mellitus without complications: Secondary | ICD-10-CM | POA: Diagnosis not present

## 2020-09-12 DIAGNOSIS — I252 Old myocardial infarction: Secondary | ICD-10-CM | POA: Diagnosis not present

## 2020-09-12 DIAGNOSIS — I10 Essential (primary) hypertension: Secondary | ICD-10-CM | POA: Diagnosis not present

## 2020-09-12 DIAGNOSIS — E782 Mixed hyperlipidemia: Secondary | ICD-10-CM | POA: Diagnosis not present

## 2020-09-12 DIAGNOSIS — I2510 Atherosclerotic heart disease of native coronary artery without angina pectoris: Secondary | ICD-10-CM | POA: Diagnosis not present

## 2020-09-12 DIAGNOSIS — Z87442 Personal history of urinary calculi: Secondary | ICD-10-CM | POA: Diagnosis not present

## 2020-09-24 DIAGNOSIS — G4733 Obstructive sleep apnea (adult) (pediatric): Secondary | ICD-10-CM | POA: Diagnosis not present

## 2020-10-25 ENCOUNTER — Inpatient Hospital Stay: Payer: PPO | Admitting: Anesthesiology

## 2020-10-25 ENCOUNTER — Inpatient Hospital Stay
Admission: EM | Admit: 2020-10-25 | Discharge: 2020-10-31 | DRG: 329 | Disposition: A | Discharge status: Home / Self Care | Payer: PPO | Attending: General Surgery | Admitting: General Surgery

## 2020-10-25 ENCOUNTER — Emergency Department: Payer: PPO

## 2020-10-25 ENCOUNTER — Other Ambulatory Visit: Payer: Self-pay

## 2020-10-25 ENCOUNTER — Encounter: Payer: Self-pay | Admitting: Emergency Medicine

## 2020-10-25 ENCOUNTER — Encounter
Admission: EM | Disposition: A | Discharge status: Home / Self Care | Payer: Self-pay | Source: Home / Self Care | Attending: General Surgery

## 2020-10-25 DIAGNOSIS — E7800 Pure hypercholesterolemia, unspecified: Secondary | ICD-10-CM | POA: Diagnosis present

## 2020-10-25 DIAGNOSIS — G4730 Sleep apnea, unspecified: Secondary | ICD-10-CM | POA: Diagnosis not present

## 2020-10-25 DIAGNOSIS — K631 Perforation of intestine (nontraumatic): Secondary | ICD-10-CM | POA: Diagnosis not present

## 2020-10-25 DIAGNOSIS — Q430 Meckel's diverticulum (displaced) (hypertrophic): Secondary | ICD-10-CM | POA: Diagnosis not present

## 2020-10-25 DIAGNOSIS — Z955 Presence of coronary angioplasty implant and graft: Secondary | ICD-10-CM | POA: Diagnosis not present

## 2020-10-25 DIAGNOSIS — K567 Ileus, unspecified: Secondary | ICD-10-CM | POA: Diagnosis not present

## 2020-10-25 DIAGNOSIS — E119 Type 2 diabetes mellitus without complications: Secondary | ICD-10-CM | POA: Diagnosis not present

## 2020-10-25 DIAGNOSIS — Z20822 Contact with and (suspected) exposure to covid-19: Secondary | ICD-10-CM | POA: Diagnosis not present

## 2020-10-25 DIAGNOSIS — Z7902 Long term (current) use of antithrombotics/antiplatelets: Secondary | ICD-10-CM | POA: Diagnosis not present

## 2020-10-25 DIAGNOSIS — F17210 Nicotine dependence, cigarettes, uncomplicated: Secondary | ICD-10-CM | POA: Diagnosis present

## 2020-10-25 DIAGNOSIS — I2510 Atherosclerotic heart disease of native coronary artery without angina pectoris: Secondary | ICD-10-CM | POA: Diagnosis present

## 2020-10-25 DIAGNOSIS — I252 Old myocardial infarction: Secondary | ICD-10-CM | POA: Diagnosis not present

## 2020-10-25 DIAGNOSIS — Z79899 Other long term (current) drug therapy: Secondary | ICD-10-CM

## 2020-10-25 DIAGNOSIS — K6389 Other specified diseases of intestine: Secondary | ICD-10-CM | POA: Diagnosis not present

## 2020-10-25 DIAGNOSIS — E669 Obesity, unspecified: Secondary | ICD-10-CM | POA: Diagnosis present

## 2020-10-25 DIAGNOSIS — I10 Essential (primary) hypertension: Secondary | ICD-10-CM | POA: Diagnosis present

## 2020-10-25 DIAGNOSIS — K650 Generalized (acute) peritonitis: Secondary | ICD-10-CM | POA: Diagnosis present

## 2020-10-25 DIAGNOSIS — Z87442 Personal history of urinary calculi: Secondary | ICD-10-CM

## 2020-10-25 DIAGNOSIS — Z6832 Body mass index (BMI) 32.0-32.9, adult: Secondary | ICD-10-CM | POA: Diagnosis not present

## 2020-10-25 DIAGNOSIS — R1031 Right lower quadrant pain: Secondary | ICD-10-CM | POA: Diagnosis not present

## 2020-10-25 HISTORY — PX: XI ROBOT ASSISTED DIAGNOSTIC LAPAROSCOPY: SHX6815

## 2020-10-25 LAB — COMPREHENSIVE METABOLIC PANEL
ALT: 57 U/L — ABNORMAL HIGH (ref 0–44)
AST: 33 U/L (ref 15–41)
Albumin: 4.2 g/dL (ref 3.5–5.0)
Alkaline Phosphatase: 45 U/L (ref 38–126)
Anion gap: 11 (ref 5–15)
BUN: 10 mg/dL (ref 8–23)
CO2: 24 mmol/L (ref 22–32)
Calcium: 9.3 mg/dL (ref 8.9–10.3)
Chloride: 96 mmol/L — ABNORMAL LOW (ref 98–111)
Creatinine, Ser: 0.88 mg/dL (ref 0.61–1.24)
GFR, Estimated: >60 mL/min (ref >60)
Glucose, Bld: 227 mg/dL — ABNORMAL HIGH (ref 70–99)
Potassium: 4.5 mmol/L (ref 3.5–5.1)
Sodium: 131 mmol/L — ABNORMAL LOW (ref 135–145)
Total Bilirubin: 1.4 mg/dL — ABNORMAL HIGH (ref 0.3–1.2)
Total Protein: 7.7 g/dL (ref 6.5–8.1)

## 2020-10-25 LAB — URINALYSIS, COMPLETE (UACMP) WITH MICROSCOPIC
Bacteria, UA: NONE SEEN
Bilirubin Urine: NEGATIVE
Glucose, UA: >=500 mg/dL — AB
Ketones, ur: NEGATIVE mg/dL
Leukocytes,Ua: NEGATIVE
Nitrite: NEGATIVE
Protein, ur: NEGATIVE mg/dL
Specific Gravity, Urine: 1.013 (ref 1.005–1.030)
pH: 5 (ref 5.0–8.0)

## 2020-10-25 LAB — CBC
HCT: 44.5 % (ref 39.0–52.0)
Hemoglobin: 14.8 g/dL (ref 13.0–17.0)
MCH: 29.4 pg (ref 26.0–34.0)
MCHC: 33.3 g/dL (ref 30.0–36.0)
MCV: 88.5 fL (ref 80.0–100.0)
Platelets: 254 10*3/uL (ref 150–400)
RBC: 5.03 MIL/uL (ref 4.22–5.81)
RDW: 13.7 % (ref 11.5–15.5)
WBC: 11.5 10*3/uL — ABNORMAL HIGH (ref 4.0–10.5)
nRBC: 0 % (ref 0.0–0.2)

## 2020-10-25 LAB — LACTIC ACID, PLASMA: Lactic Acid, Venous: 1.3 mmol/L (ref 0.5–1.9)

## 2020-10-25 LAB — LIPASE, BLOOD: Lipase: 38 U/L (ref 11–51)

## 2020-10-25 LAB — RESP PANEL BY RT-PCR (FLU A&B, COVID) ARPGX2
Influenza A by PCR: NEGATIVE
Influenza B by PCR: NEGATIVE
SARS Coronavirus 2 by RT PCR: NEGATIVE

## 2020-10-25 LAB — CBG MONITORING, ED
Glucose-Capillary: 121 mg/dL — ABNORMAL HIGH (ref 70–99)
Glucose-Capillary: 171 mg/dL — ABNORMAL HIGH (ref 70–99)

## 2020-10-25 SURGERY — LAPAROSCOPY, DIAGNOSTIC, ROBOT-ASSISTED
Anesthesia: General

## 2020-10-25 MED ORDER — SUGAMMADEX SODIUM 500 MG/5ML IV SOLN
INTRAVENOUS | Status: DC | PRN
  Administered 2020-10-25: 400 mg via INTRAVENOUS
  Administered 2020-10-25: 100 mg via INTRAVENOUS

## 2020-10-25 MED ORDER — SODIUM CHLORIDE 0.9 % IV BOLUS: 500.0000 mL | Freq: Once | INTRAVENOUS | Status: DC

## 2020-10-25 MED ORDER — GLYCOPYRROLATE 0.2 MG/ML IJ SOLN
INTRAMUSCULAR | Status: DC | PRN
  Administered 2020-10-25: .2 mg via INTRAVENOUS

## 2020-10-25 MED ORDER — EPHEDRINE SULFATE 50 MG/ML IJ SOLN
INTRAMUSCULAR | Status: DC | PRN
  Administered 2020-10-25: 10 mg via INTRAVENOUS

## 2020-10-25 MED ORDER — ACETAMINOPHEN 650 MG RE SUPP: 650.0000 mg | Freq: Four times a day (QID) | RECTAL | Status: DC | PRN

## 2020-10-25 MED ORDER — ALBUTEROL SULFATE HFA 108 (90 BASE) MCG/ACT IN AERS
2.0000 | INHALATION_SPRAY | Freq: Four times a day (QID) | RESPIRATORY_TRACT | Status: DC | PRN
  Filled 2020-10-25: qty 6.7

## 2020-10-25 MED ORDER — BUPIVACAINE LIPOSOME 1.3 % IJ SUSP
INTRAMUSCULAR | Status: AC
  Filled 2020-10-25: qty 20

## 2020-10-25 MED ORDER — OXYCODONE HCL 5 MG/5ML PO SOLN: 5.0000 mg | Freq: Once | ORAL | Status: DC | PRN

## 2020-10-25 MED ORDER — LOSARTAN POTASSIUM 50 MG PO TABS
50.0000 mg | ORAL_TABLET | Freq: Every day | ORAL | Status: DC
  Administered 2020-10-26 – 2020-10-29 (×4): 50 mg via ORAL
  Filled 2020-10-25 (×4): qty 1

## 2020-10-25 MED ORDER — MORPHINE SULFATE (PF) 4 MG/ML IV SOLN
4.0000 mg | INTRAVENOUS | Status: DC | PRN
  Administered 2020-10-25: 4 mg via INTRAVENOUS
  Filled 2020-10-25: qty 1

## 2020-10-25 MED ORDER — ACETAMINOPHEN 10 MG/ML IV SOLN
INTRAVENOUS | Status: DC | PRN
  Administered 2020-10-25: 1000 mg via INTRAVENOUS

## 2020-10-25 MED ORDER — MORPHINE SULFATE (PF) 4 MG/ML IV SOLN
4.0000 mg | INTRAVENOUS | Status: DC | PRN
  Administered 2020-10-26: 4 mg via INTRAVENOUS
  Filled 2020-10-25: qty 1

## 2020-10-25 MED ORDER — PANTOPRAZOLE SODIUM 40 MG IV SOLR
40.0000 mg | Freq: Every day | INTRAVENOUS | Status: DC
  Administered 2020-10-25 – 2020-10-30 (×6): 40 mg via INTRAVENOUS
  Filled 2020-10-25 (×6): qty 40

## 2020-10-25 MED ORDER — ROCURONIUM BROMIDE 10 MG/ML (PF) SYRINGE
PREFILLED_SYRINGE | INTRAVENOUS | Status: AC
  Filled 2020-10-25: qty 10

## 2020-10-25 MED ORDER — ROCURONIUM BROMIDE 100 MG/10ML IV SOLN
INTRAVENOUS | Status: DC | PRN
  Administered 2020-10-25: 50 mg via INTRAVENOUS
  Administered 2020-10-25: 20 mg via INTRAVENOUS
  Administered 2020-10-25: 30 mg via INTRAVENOUS

## 2020-10-25 MED ORDER — PHENYLEPHRINE HCL (PRESSORS) 10 MG/ML IV SOLN
INTRAVENOUS | Status: DC | PRN
  Administered 2020-10-25: 100 ug via INTRAVENOUS

## 2020-10-25 MED ORDER — FENTANYL CITRATE (PF) 100 MCG/2ML IJ SOLN
INTRAMUSCULAR | Status: AC
  Filled 2020-10-25: qty 2

## 2020-10-25 MED ORDER — ONDANSETRON 4 MG PO TBDP: 4.0000 mg | ORAL_TABLET | Freq: Four times a day (QID) | ORAL | Status: DC | PRN

## 2020-10-25 MED ORDER — FENTANYL CITRATE (PF) 100 MCG/2ML IJ SOLN
25.0000 ug | INTRAMUSCULAR | Status: DC | PRN
  Administered 2020-10-25: 50 ug via INTRAVENOUS

## 2020-10-25 MED ORDER — SODIUM CHLORIDE 0.9 % IV BOLUS
500.0000 mL | Freq: Once | INTRAVENOUS | Status: AC
  Administered 2020-10-25: 500 mL via INTRAVENOUS

## 2020-10-25 MED ORDER — FENTANYL CITRATE (PF) 100 MCG/2ML IJ SOLN
INTRAMUSCULAR | Status: AC
  Administered 2020-10-25: 50 ug via INTRAVENOUS
  Filled 2020-10-25: qty 2

## 2020-10-25 MED ORDER — BUPIVACAINE HCL (PF) 0.5 % IJ SOLN
INTRAMUSCULAR | Status: DC | PRN
  Administered 2020-10-25: 30 mL

## 2020-10-25 MED ORDER — OXYCODONE HCL 5 MG PO TABS: 5.0000 mg | ORAL_TABLET | Freq: Once | ORAL | Status: DC | PRN

## 2020-10-25 MED ORDER — PROPOFOL 10 MG/ML IV BOLUS
INTRAVENOUS | Status: DC | PRN
  Administered 2020-10-25: 150 mg via INTRAVENOUS

## 2020-10-25 MED ORDER — HYDROMORPHONE HCL 1 MG/ML IJ SOLN
INTRAMUSCULAR | Status: DC | PRN
  Administered 2020-10-25: 1 mg via INTRAVENOUS

## 2020-10-25 MED ORDER — PIPERACILLIN-TAZOBACTAM 3.375 G IVPB 30 MIN
3.3750 g | Freq: Once | INTRAVENOUS | Status: AC
  Administered 2020-10-25: 3.375 g via INTRAVENOUS
  Filled 2020-10-25: qty 50

## 2020-10-25 MED ORDER — PIPERACILLIN-TAZOBACTAM 3.375 G IVPB
3.3750 g | Freq: Three times a day (TID) | INTRAVENOUS | Status: DC
  Administered 2020-10-25 – 2020-10-31 (×17): 3.375 g via INTRAVENOUS
  Filled 2020-10-25 (×16): qty 50

## 2020-10-25 MED ORDER — PIPERACILLIN-TAZOBACTAM 3.375 G IVPB: 3.3750 g | Freq: Three times a day (TID) | INTRAVENOUS | Status: DC

## 2020-10-25 MED ORDER — ACETAMINOPHEN 325 MG PO TABS: 650.0000 mg | ORAL_TABLET | Freq: Four times a day (QID) | ORAL | Status: DC | PRN

## 2020-10-25 MED ORDER — SUCCINYLCHOLINE CHLORIDE 200 MG/10ML IV SOSY
PREFILLED_SYRINGE | INTRAVENOUS | Status: AC
  Filled 2020-10-25: qty 10

## 2020-10-25 MED ORDER — SODIUM CHLORIDE 0.9 % IV SOLN: INTRAVENOUS | Status: DC

## 2020-10-25 MED ORDER — DEXAMETHASONE SODIUM PHOSPHATE 10 MG/ML IJ SOLN
INTRAMUSCULAR | Status: DC | PRN
  Administered 2020-10-25: 10 mg via INTRAVENOUS

## 2020-10-25 MED ORDER — DEXMEDETOMIDINE (PRECEDEX) IN NS 20 MCG/5ML (4 MCG/ML) IV SYRINGE
PREFILLED_SYRINGE | INTRAVENOUS | Status: DC | PRN
Start: 2020-10-25 — End: 2020-10-25
  Administered 2020-10-25: 8 ug via INTRAVENOUS
  Administered 2020-10-25: 12 ug via INTRAVENOUS

## 2020-10-25 MED ORDER — METOPROLOL SUCCINATE ER 50 MG PO TB24
50.0000 mg | ORAL_TABLET | Freq: Every day | ORAL | Status: DC
  Administered 2020-10-26 – 2020-10-31 (×6): 50 mg via ORAL
  Filled 2020-10-25 (×6): qty 1

## 2020-10-25 MED ORDER — IOHEXOL 300 MG/ML  SOLN
100.0000 mL | Freq: Once | INTRAMUSCULAR | Status: AC | PRN
  Administered 2020-10-25: 100 mL via INTRAVENOUS

## 2020-10-25 MED ORDER — SEVOFLURANE IN SOLN
RESPIRATORY_TRACT | Status: AC
  Filled 2020-10-25: qty 250

## 2020-10-25 MED ORDER — BUPIVACAINE LIPOSOME 1.3 % IJ SUSP
INTRAMUSCULAR | Status: DC | PRN
  Administered 2020-10-25: 20 mL

## 2020-10-25 MED ORDER — ESMOLOL HCL 100 MG/10ML IV SOLN
INTRAVENOUS | Status: DC | PRN
  Administered 2020-10-25: 15 mg via INTRAVENOUS

## 2020-10-25 MED ORDER — BUPIVACAINE HCL (PF) 0.5 % IJ SOLN
INTRAMUSCULAR | Status: AC
  Filled 2020-10-25: qty 30

## 2020-10-25 MED ORDER — HYDROCODONE-ACETAMINOPHEN 5-325 MG PO TABS
1.0000 | ORAL_TABLET | ORAL | Status: DC | PRN
  Administered 2020-10-27: 2 via ORAL
  Filled 2020-10-25: qty 2

## 2020-10-25 MED ORDER — INSULIN ASPART 100 UNIT/ML IJ SOLN
0.0000 [IU] | Freq: Three times a day (TID) | INTRAMUSCULAR | Status: DC
  Administered 2020-10-26 – 2020-10-28 (×5): 3 [IU] via SUBCUTANEOUS
  Administered 2020-10-29: 2 [IU] via SUBCUTANEOUS
  Filled 2020-10-25 (×6): qty 1

## 2020-10-25 MED ORDER — ACETAMINOPHEN 10 MG/ML IV SOLN
INTRAVENOUS | Status: AC
  Filled 2020-10-25: qty 100

## 2020-10-25 MED ORDER — SUCCINYLCHOLINE CHLORIDE 20 MG/ML IJ SOLN
INTRAMUSCULAR | Status: DC | PRN
  Administered 2020-10-25: 100 mg via INTRAVENOUS

## 2020-10-25 MED ORDER — DEXAMETHASONE SODIUM PHOSPHATE 10 MG/ML IJ SOLN
INTRAMUSCULAR | Status: AC
  Filled 2020-10-25: qty 1

## 2020-10-25 MED ORDER — KETAMINE HCL 50 MG/5ML IJ SOSY
PREFILLED_SYRINGE | INTRAMUSCULAR | Status: AC
  Filled 2020-10-25: qty 5

## 2020-10-25 MED ORDER — TAMSULOSIN HCL 0.4 MG PO CAPS: 0.4000 mg | ORAL_CAPSULE | Freq: Every day | ORAL | Status: DC

## 2020-10-25 MED ORDER — ONDANSETRON HCL 4 MG/2ML IJ SOLN
4.0000 mg | Freq: Four times a day (QID) | INTRAMUSCULAR | Status: DC | PRN
  Administered 2020-10-25 – 2020-10-29 (×5): 4 mg via INTRAVENOUS
  Filled 2020-10-25 (×3): qty 2

## 2020-10-25 MED ORDER — METOPROLOL TARTRATE 50 MG PO TABS: 50.0000 mg | ORAL_TABLET | Freq: Every day | ORAL | Status: DC

## 2020-10-25 MED ORDER — ENOXAPARIN SODIUM 40 MG/0.4ML IJ SOSY
40.0000 mg | PREFILLED_SYRINGE | INTRAMUSCULAR | Status: DC
  Administered 2020-10-26 – 2020-10-31 (×6): 40 mg via SUBCUTANEOUS
  Filled 2020-10-25 (×6): qty 0.4

## 2020-10-25 MED ORDER — FENTANYL CITRATE (PF) 100 MCG/2ML IJ SOLN
INTRAMUSCULAR | Status: DC | PRN
  Administered 2020-10-25: 100 ug via INTRAVENOUS

## 2020-10-25 MED ORDER — PROPOFOL 10 MG/ML IV BOLUS
INTRAVENOUS | Status: AC
  Filled 2020-10-25: qty 20

## 2020-10-25 MED ORDER — HYDROMORPHONE HCL 1 MG/ML IJ SOLN
INTRAMUSCULAR | Status: AC
  Filled 2020-10-25: qty 1

## 2020-10-25 MED ORDER — KETAMINE HCL 10 MG/ML IJ SOLN
INTRAMUSCULAR | Status: DC | PRN
  Administered 2020-10-25: 20 mg via INTRAVENOUS
  Administered 2020-10-25: 10 mg via INTRAVENOUS

## 2020-10-25 MED ORDER — ONDANSETRON HCL 4 MG/2ML IJ SOLN
4.0000 mg | Freq: Once | INTRAMUSCULAR | Status: AC
  Administered 2020-10-25: 4 mg via INTRAVENOUS
  Filled 2020-10-25: qty 2

## 2020-10-25 SURGICAL SUPPLY — 105 items
ADH SKN CLS APL DERMABOND .7 (GAUZE/BANDAGES/DRESSINGS)
APL PRP STRL LF DISP 70% ISPRP (MISCELLANEOUS) ×1
BLADE CLIPPER SURG (BLADE) ×2 IMPLANT
BLADE SURG SZ10 CARB STEEL (BLADE) ×2 IMPLANT
BLADE SURG SZ11 CARB STEEL (BLADE) ×2 IMPLANT
BULB RESERV EVAC DRAIN JP 100C (MISCELLANEOUS) ×1 IMPLANT
CANNULA REDUC XI 12-8 STAPL (CANNULA) ×2
CANNULA REDUCER 12-8 DVNC XI (CANNULA) ×1 IMPLANT
CHLORAPREP W/TINT 26 (MISCELLANEOUS) ×2 IMPLANT
CLIP VESOLOCK MED LG 6/CT (CLIP) IMPLANT
COVER TIP SHEARS 8 DVNC (MISCELLANEOUS) ×1 IMPLANT
COVER TIP SHEARS 8MM DA VINCI (MISCELLANEOUS) ×2
COVER WAND RF STERILE (DRAPES) IMPLANT
DEFOGGER SCOPE WARMER CLEARIFY (MISCELLANEOUS) ×2 IMPLANT
DERMABOND ADVANCED (GAUZE/BANDAGES/DRESSINGS)
DERMABOND ADVANCED .7 DNX12 (GAUZE/BANDAGES/DRESSINGS) IMPLANT
DRAIN CHANNEL JP 19F (MISCELLANEOUS) ×1 IMPLANT
DRAPE ARM DVNC X/XI (DISPOSABLE) ×4 IMPLANT
DRAPE COLUMN DVNC XI (DISPOSABLE) ×1 IMPLANT
DRAPE DA VINCI XI ARM (DISPOSABLE) ×8
DRAPE DA VINCI XI COLUMN (DISPOSABLE) ×2
DRAPE INCISE IOBAN 66X45 STRL (DRAPES) ×1 IMPLANT
DRSG AQUACEL AG ADV 3.5X10 (GAUZE/BANDAGES/DRESSINGS) ×1 IMPLANT
DRSG OPSITE POSTOP 4X10 (GAUZE/BANDAGES/DRESSINGS) IMPLANT
DRSG OPSITE POSTOP 4X8 (GAUZE/BANDAGES/DRESSINGS) IMPLANT
ELECT BLADE 6.5 EXT (BLADE) IMPLANT
ELECT CAUTERY BLADE 6.4 (BLADE) IMPLANT
ELECT REM PT RETURN 9FT ADLT (ELECTROSURGICAL) ×2
ELECTRODE REM PT RTRN 9FT ADLT (ELECTROSURGICAL) ×1 IMPLANT
GAUZE SPONGE 4X4 12PLY STRL (GAUZE/BANDAGES/DRESSINGS) ×1 IMPLANT
GLOVE SURG ENC MOIS LTX SZ6.5 (GLOVE) ×6 IMPLANT
GLOVE SURG UNDER POLY LF SZ6.5 (GLOVE) ×6 IMPLANT
GOWN STRL REUS W/ TWL LRG LVL3 (GOWN DISPOSABLE) ×6 IMPLANT
GOWN STRL REUS W/TWL LRG LVL3 (GOWN DISPOSABLE) ×4
HANDLE YANKAUER SUCT BULB TIP (MISCELLANEOUS) ×2 IMPLANT
IRRIGATION STRYKERFLOW (MISCELLANEOUS) IMPLANT
IRRIGATOR STRYKERFLOW (MISCELLANEOUS)
IRRIGATOR SUCT 8 DISP DVNC XI (IRRIGATION / IRRIGATOR) IMPLANT
IRRIGATOR SUCTION 8MM XI DISP (IRRIGATION / IRRIGATOR)
IV NS 1000ML (IV SOLUTION)
IV NS 1000ML BAXH (IV SOLUTION) IMPLANT
KIT IMAGING PINPOINTPAQ (MISCELLANEOUS) ×1 IMPLANT
KIT PINK PAD W/HEAD ARE REST (MISCELLANEOUS) ×2
KIT PINK PAD W/HEAD ARM REST (MISCELLANEOUS) ×1 IMPLANT
LABEL OR SOLS (LABEL) IMPLANT
LIGASURE IMPACT 36 18CM CVD LR (INSTRUMENTS) ×1 IMPLANT
MANIFOLD NEPTUNE II (INSTRUMENTS) ×2 IMPLANT
NDL INSUFFLATION 14GA 120MM (NEEDLE) ×1 IMPLANT
NEEDLE HYPO 22GX1.5 SAFETY (NEEDLE) ×2 IMPLANT
NEEDLE INSUFFLATION 14GA 120MM (NEEDLE) ×2 IMPLANT
NS IRRIG 1000ML POUR BTL (IV SOLUTION) ×4 IMPLANT
OBTURATOR OPTICAL STANDARD 8MM (TROCAR) ×2
OBTURATOR OPTICAL STND 8 DVNC (TROCAR) ×1
OBTURATOR OPTICALSTD 8 DVNC (TROCAR) ×1 IMPLANT
PACK COLON CLEAN CLOSURE (MISCELLANEOUS) ×1 IMPLANT
PACK LAP CHOLECYSTECTOMY (MISCELLANEOUS) ×2 IMPLANT
PENCIL ELECTRO HAND CTR (MISCELLANEOUS) ×2 IMPLANT
PORT ACCESS TROCAR AIRSEAL 5 (TROCAR) ×1 IMPLANT
RELOAD LINEAR CUT PROX 55 BLUE (ENDOMECHANICALS) ×8 IMPLANT
RELOAD STAPLE 45 3.5 BLU DVNC (STAPLE) IMPLANT
RELOAD STAPLE 55 3.8 BLU REG (ENDOMECHANICALS) IMPLANT
RELOAD STAPLE 60 3.5 BLU DVNC (STAPLE) IMPLANT
RELOAD STAPLER 3.5X45 BLU DVNC (STAPLE) IMPLANT
RELOAD STAPLER 3.5X60 BLU DVNC (STAPLE) IMPLANT
SEAL CANN UNIV 5-8 DVNC XI (MISCELLANEOUS) ×3 IMPLANT
SEAL XI 5MM-8MM UNIVERSAL (MISCELLANEOUS) ×6
SEALER VESSEL DA VINCI XI (MISCELLANEOUS)
SEALER VESSEL EXT DVNC XI (MISCELLANEOUS) IMPLANT
SET TRI-LUMEN FLTR TB AIRSEAL (TUBING) ×1 IMPLANT
SOLUTION ELECTROLUBE (MISCELLANEOUS) ×2 IMPLANT
SPONGE LAP 4X18 RFD (DISPOSABLE) ×1 IMPLANT
STAPLER 45 DA VINCI SURE FORM (STAPLE)
STAPLER 45 SUREFORM DVNC (STAPLE) IMPLANT
STAPLER 60 DA VINCI SURE FORM (STAPLE)
STAPLER 60 SUREFORM DVNC (STAPLE) IMPLANT
STAPLER CANNULA SEAL DVNC XI (STAPLE) ×1 IMPLANT
STAPLER CANNULA SEAL XI (STAPLE) ×2
STAPLER PROXIMATE 55 BLUE (STAPLE) ×1 IMPLANT
STAPLER RELOAD 3.5X45 BLU DVNC (STAPLE)
STAPLER RELOAD 3.5X45 BLUE (STAPLE)
STAPLER RELOAD 3.5X60 BLU DVNC (STAPLE)
STAPLER RELOAD 3.5X60 BLUE (STAPLE)
STAPLER SKIN PROX 35W (STAPLE) ×1 IMPLANT
SUT ETHILON 3-0 FS-10 30 BLK (SUTURE) ×2
SUT MNCRL 4-0 (SUTURE) ×2
SUT MNCRL 4-0 27XMFL (SUTURE) ×1
SUT MNCRL AB 4-0 PS2 18 (SUTURE) ×1 IMPLANT
SUT PDS PLUS 0 (SUTURE)
SUT PDS PLUS AB 0 CT-2 (SUTURE) ×2 IMPLANT
SUT SILK 2 0 SH (SUTURE) ×1 IMPLANT
SUT SILK 3-0 (SUTURE) ×1 IMPLANT
SUT STRATAFIX 0 PDS+ CT-2 23 (SUTURE) ×4
SUT VIC AB 3-0 SH 27 (SUTURE)
SUT VIC AB 3-0 SH 27X BRD (SUTURE) ×4 IMPLANT
SUT VICRYL 0 AB UR-6 (SUTURE) ×3 IMPLANT
SUT VLOC 90 6 CV-15 VIOLET (SUTURE) ×1 IMPLANT
SUTURE EHLN 3-0 FS-10 30 BLK (SUTURE) IMPLANT
SUTURE MNCRL 4-0 27XMF (SUTURE) ×2 IMPLANT
SUTURE STRATFX 0 PDS+ CT-2 23 (SUTURE) IMPLANT
SYR 30ML LL (SYRINGE) ×2 IMPLANT
SYS LAPSCP GELPORT 120MM (MISCELLANEOUS) ×2
SYS TROCAR 1.5-3 SLV ABD GEL (ENDOMECHANICALS)
SYSTEM LAPSCP GELPORT 120MM (MISCELLANEOUS) IMPLANT
SYSTEM TROCR 1.5-3 SLV ABD GEL (ENDOMECHANICALS) ×1 IMPLANT
TRAY FOLEY MTR SLVR 16FR STAT (SET/KITS/TRAYS/PACK) ×2 IMPLANT

## 2020-10-25 NOTE — Op Note (Signed)
Pre-op Diagnosis: Bowel perforation   Post op Diagnosis: Perforation of ileum.   Procedure: Robotic assisted diagnostic laparoscopy.                      Hand-assisted small bowel resection with anastomosis  Anesthesia: GETA  Surgeon: Carolan Shiver, MD, FACS  Wound Classification: Contaminated  Specimen: Small bowel  Complications: None  Estimated Blood Loss: 50 mL   Indications: Patient is a 70 y.o. male  presented with above right lower quadrant pain. CT scan shows acute perforation of the small intestine.     FIndings: 1.  Meckel's diverticulum 2 feet away from cecum.  Meckel's was soft without signs of perforation   2.  3 inches proximal to the Meckel's diverticulum was an area of induration on the mesenteric side of the small bowel.  3.  Purulent peritonitis identified upon entering the abdomen   4. Adequate hemostasis.   5.  Evaluation of the specimen shows a sharp foreign object inside the lumen with small perforation.   Description of procedure: The patient was placed on the operating table in the supine position. General anesthesia was induced. A time-out was completed verifying correct patient, procedure, site, positioning, and implant(s) and/or special equipment prior to beginning this procedure. The abdomen was prepped and draped in the usual sterile fashion.   Palmer's point located and Veress needle was inserted.  After confirming 2 clicks and a positive saline drop test, gas insufflation was initiated until the abdominal pressure was measured at 15 mmHg.  Afterwards, the Veress needle was removed and a 8 mm port was placed in left upper quadrant area using Optiview technique.  After local was infused, 3 additional incision on the left abdominal wall were made 5 cm apart.  An 12 mm port and two other 8 mm ports were placed under direct visualization.  No injuries from trocar placements were noted.  The table was placed in the Trendelenburg position with the  right side elevated.  With the use of Tip up grasper, Force Bipolar and, the small bowel was visualized starting from terminal ileum and run proximally.  The appendix looks normal.  2 feet away from the cecum the mucosa reticulum was identified but without any sign of perforation.  Since it was.  His peritonitis on the right lower quadrant I decided to proceed with hand-assisted laparoscopy to be able to palpate the small bowel to identified what was the perforated bowel.  A 5 cm midline incision was done supraumbilically.  Dissection was taken down to the fascia.  A GelPort was placed.  With my right hand I again ran the small bowel from cecum proximally.  I palpated the mucous diverticulum that was soft without inflammation.  5 inches proximally I palpated a indurated area on the mesenteric side of the small bowel.  I was able to exteriorize this portion.  I proceeded to perform a small bowel resection.  The point of transection where and the small bowel divided with GIA.  The mesentery was divided with LigaSure impact.  I explored the specimen on the back table and identify a sharp plastic foreign object with the puncture perforation on the mesenteric aspect of the small bowel.  After scrubbing again I proceeded to do the small bowel anastomosis.  I placed the 2 segment of small bowel x-ray shoulder.  I perform enterotomy in each segment of the small bowel.  I sent her a GIA.  Anastomosis was done in side-to-side fashion.  Enterotomy closed with GIA.  Mesenteric defect was closed with silk.  The abdominal cavity was irrigated with 2 L of saline.  A 19 French was left in the right lower quadrant and pelvis.  After ensuring adequate hemostasis I then changed my gloves.  I closed the midline fascia with 0 STRATAFIX.  I closed the 12 mm trocar with 0 Vicryl.  I closed the skin with staples.  An  blue load linear cutting stapler was then used to divide and staple the base of the appendix. Mesoappendix was  divided with Vessel sealer. The appendix was placed in an endoscopic retrieval bag and removed.   The appendiceal stump was examined and hemostasis noted. No other pathology was identified within pelvis. The 12 mm trocar removed and port site closed with PMI using 0 vicryl under direct vision. Remaining trocars were removed under direct vision. No bleeding was noted.The abdomen was allowed to collapse.  All skin incisions then closed with subcuticular sutures Monocryl 4-0.  Wounds then dressed with dermabond.  The patient tolerated the procedure well, awakened from anesthesia and was taken to the postanesthesia care unit in satisfactory condition.  Sponge count and instrument count correct at the end of the procedure.

## 2020-10-25 NOTE — Anesthesia Procedure Notes (Signed)
Procedure Name: Intubation Date/Time: 10/25/2020 4:26 PM Performed by: Manning Charity, CRNA Pre-anesthesia Checklist: Patient identified, Emergency Drugs available, Suction available and Patient being monitored Patient Re-evaluated:Patient Re-evaluated prior to induction Oxygen Delivery Method: Circle system utilized Preoxygenation: Pre-oxygenation with 100% oxygen Induction Type: IV induction Ventilation: Mask ventilation without difficulty Laryngoscope Size: McGraph and 4 Grade View: Grade II Tube type: Oral Number of attempts: 1 Airway Equipment and Method: Stylet and Oral airway Placement Confirmation: ETT inserted through vocal cords under direct vision,  positive ETCO2 and breath sounds checked- equal and bilateral Tube secured with: Tape Dental Injury: Teeth and Oropharynx as per pre-operative assessment

## 2020-10-25 NOTE — H&P (Signed)
History of Present Illness Geoffrey Branch is a 70 y.o. male with history of diabetes and hypertension that comes to the ED for evaluation of abdominal pain since 2 days ago.  The patient initially thought that it was a kidney stone as usual but the pain has been getting worse in the last 2 days.  Today the pain is very intense in the right lower quadrant.  The pain is still localized to the right lower quadrant.  There is no pain radiation.  Patient denies any fever or chills.  Aggravating factor is moving his abdominal wall and applying pressure.  There has been no alleviating factors.  Patient denies any nausea or vomiting.  At the ED he was found with leukocytosis.  Due to suspicious of appendicitis CT scan of the abdominal pelvis was performed.  This showed a portion of the ileum with pneumatosis and air outside of the intestine.  There was no free air under the diaphragm.  Mesenteric vessels appear patent with some atherosclerotic disease.  There was no significant free fluid.  Lactic acid in process.  I personally evaluated the images of the CT scan I discussed personally with radiologist.  Past Medical History Past Medical History:  Diagnosis Date  . Coronary artery disease 2004   stent placed  . Dysrhythmia   . Hypercholesteremia   . Hypertension   . Myocardial infarction (HCC)   . Sleep apnea       Past Surgical History:  Procedure Laterality Date  . CORONARY STENT PLACEMENT  2004  . EXTRACORPOREAL SHOCK WAVE LITHOTRIPSY Right 06/06/2015   Procedure: EXTRACORPOREAL SHOCK WAVE LITHOTRIPSY (ESWL);  Surgeon: Orson Ape, MD;  Location: [facility] ORS;  Service: Urology;  Laterality: Right;  . LITHOTRIPSY  11/2013    No Known Allergies  Current Facility-Administered Medications  Medication Dose Route Frequency Provider Last Rate Last Admin  . piperacillin-tazobactam (ZOSYN) IVPB 3.375 g  3.375 g Intravenous Once Willy Eddy, MD 100 mL/hr at 10/25/20 1234 3.375 g at 10/25/20 1234    Current Outpatient Medications  Medication Sig Dispense Refill  . albuterol (PROVENTIL HFA;VENTOLIN HFA) 108 (90 BASE) MCG/ACT inhaler Inhale 2 puffs into the lungs every 6 (six) hours as needed for wheezing or shortness of breath.    . clopidogrel (PLAVIX) 75 MG tablet Take 75 mg by mouth daily.    Marland Kitchen docusate sodium (COLACE) 100 MG capsule Take 2 capsules (200 mg total) by mouth 2 (two) times daily. 120 capsule 3  . levofloxacin (LEVAQUIN) 500 MG tablet Take 1 tablet (500 mg total) by mouth daily. 7 tablet 0  . losartan (COZAAR) 50 MG tablet Take 50 mg by mouth daily.    . metoprolol (LOPRESSOR) 50 MG tablet Take 50 mg by mouth daily.    . nitroGLYCERIN (NITROSTAT) 0.3 MG SL tablet Place 0.3 mg under the tongue every 5 (five) minutes as needed for chest pain.    . NUCYNTA 50 MG TABS tablet Take 1 tablet (50 mg total) by mouth every 6 (six) hours as needed for moderate pain. 1 TO 2 TABS Q 6 HOURS PRN PAIN 30 tablet 0  . ondansetron (ZOFRAN ODT) 8 MG disintegrating tablet Take 1 tablet (8 mg total) by mouth every 6 (six) hours as needed for nausea or vomiting. 10 tablet 3  . ondansetron (ZOFRAN) 4 MG tablet Take 4 mg by mouth every 8 (eight) hours as needed for nausea or vomiting.    Marland Kitchen oxycodone (OXY-IR) 5 MG capsule Take 5 mg  by mouth every 4 (four) hours as needed for pain.    . pravastatin (PRAVACHOL) 20 MG tablet Take 20 mg by mouth daily.    . promethazine (PHENERGAN) 25 MG tablet Take 25 mg by mouth every 6 (six) hours as needed for nausea or vomiting.    . tamsulosin (FLOMAX) 0.4 MG CAPS capsule Take 0.4 mg by mouth daily.    . tamsulosin (FLOMAX) 0.4 MG CAPS capsule Take 1 capsule (0.4 mg total) by mouth daily. 30 capsule 11  . tapentadol (NUCYNTA) 50 MG TABS tablet Take 50 mg by mouth every 6 (six) hours as needed for moderate pain.    Marland Kitchen umeclidinium-vilanterol (ANORO ELLIPTA) 62.5-25 MCG/INH AEPB Inhale into the lungs.      Family History No family history on file.     Social  History Social History   Tobacco Use  . Smoking status: Former Smoker    Packs/day: 1.00    Years: 40.00    Pack years: 40.00    Quit date: 10/17/2002    Years since quitting: 18.0  . Tobacco comment: 40 pack year smoker  Substance Use Topics  . Alcohol use: Yes    Comment: rare alcohol use  . Drug use: No        ROS Full ROS of systems performed and is otherwise negative there than what is stated in the HPI  Physical Exam Blood pressure (!) 152/109, pulse 77, temperature 98.4 F (36.9 C), temperature source Oral, resp. rate 18, height 5\' 7"  (1.702 m), weight 95.3 kg, SpO2 98 %.  CONSTITUTIONAL: Alert, oriented x3 EYES: Pupils equal, round, and reactive to light, Sclera non-icteric. EARS, NOSE, MOUTH AND THROAT: The oropharynx is clear. Oral mucosa is pink and moist. Hearing is intact to voice.  NECK: Trachea is midline, and there is no jugular venous distension. Thyroid is without palpable abnormalities. LYMPH NODES:  Lymph nodes in the neck are not enlarged. RESPIRATORY:  Lungs are clear, and breath sounds are equal bilaterally. Normal respiratory effort without pathologic use of accessory muscles. CARDIOVASCULAR: Heart is regular without murmurs, gallops, or rubs. GI: The abdomen is soft, very tender to palpation in the right lower quadrant, and nondistended. There were no palpable masses. There was no hepatosplenomegaly. There were normal bowel sounds. GU: Deferred MUSCULOSKELETAL:  Normal muscle strength and tone in all four extremities.    SKIN: Skin turgor is normal. There are no pathologic skin lesions.  NEUROLOGIC:  Motor and sensation is grossly normal.  Cranial nerves are grossly intact. PSYCH:  Alert and oriented to person, place and time. Affect is normal.  Data Reviewed Personally evaluated the labs showing leukocytosis.  I also evaluated the CT scan that shows pneumatosis of the ileum with adjacent free air.  I have personally reviewed the patient's imaging  and medical records.    Assessment    Patient with suspected small bowel perforation with acute abdomen.  I discussed with the patient the recommendation regarding going to the operating room for diagnostic laparoscopy and possible resection of the perforated intestine.  I discussed with him the alternative of small bowel resection and anastomosis versus the need of formal right colectomy.  I discussed with the patient the low chance of having an ostomy.  I discussed with the patient the risk of surgery that include bleeding, infection, intra-abdominal abscess, enterocutaneous fistula and other risk of anesthesia such as pneumonia, myocardial infarction, DVT.  I discussed with the patient alternative of observation.  Due to patient's pain and  worsening symptoms he agreed to proceed with surgical management.  Plan    Admit to surgical service Will take to the OR for diagnostic laparoscopy versus exploratory laparotomy and possible bowel resection Will continue with home medication for chronic artery disease, hypertension diabetes. IV fluids N.p.o. We will start with IV antibiotic therapy for bowel perforation   Carolan Shiver, MD  Carolan Shiver 10/25/2020, 12:45 PM

## 2020-10-25 NOTE — Anesthesia Preprocedure Evaluation (Addendum)
Anesthesia Evaluation  Patient identified by MRN, date of birth, ID band Patient awake    Reviewed: Allergy & Precautions, H&P , NPO status , Patient's Chart, lab work & pertinent test results  History of Anesthesia Complications Negative for: history of anesthetic complications  Airway Mallampati: III  TM Distance: <3 FB Neck ROM: full    Dental  (+) Chipped   Pulmonary sleep apnea , COPD, former smoker,    Pulmonary exam normal        Cardiovascular Exercise Tolerance: Good hypertension, (-) angina+ CAD, + Past MI and + Cardiac Stents  Normal cardiovascular exam+ dysrhythmias      Neuro/Psych negative neurological ROS  negative psych ROS   GI/Hepatic Neg liver ROS, GERD  Medicated and Controlled,  Endo/Other  diabetes, Type 2, Oral Hypoglycemic Agents  Renal/GU      Musculoskeletal   Abdominal   Peds  Hematology negative hematology ROS (+)   Anesthesia Other Findings Past Medical History: 2004: Coronary artery disease     Comment:  stent placed No date: Dysrhythmia No date: Hypercholesteremia No date: Hypertension No date: Myocardial infarction [facility]) No date: Sleep apnea  Past Surgical History: 2004: CORONARY STENT PLACEMENT 06/06/2015: EXTRACORPOREAL SHOCK WAVE LITHOTRIPSY; Right     Comment:  Procedure: EXTRACORPOREAL SHOCK WAVE LITHOTRIPSY (ESWL);              Surgeon: Orson Ape, MD;  Location: [facility] ORS;                Service: Urology;  Laterality: Right; 11/2013: LITHOTRIPSY  BMI    Body Mass Index: 32.89 kg/m      Reproductive/Obstetrics negative OB ROS                            Anesthesia Physical Anesthesia Plan  ASA: III and emergent  Anesthesia Plan: General ETT   Post-op Pain Management:    Induction: Intravenous  PONV Risk Score and Plan: Ondansetron, Dexamethasone, Midazolam and Treatment may vary due to age or medical condition  Airway  Management Planned: Oral ETT  Additional Equipment:   Intra-op Plan:   Post-operative Plan: Extubation in OR  Informed Consent: I have reviewed the patients History and Physical, chart, labs and discussed the procedure including the risks, benefits and alternatives for the proposed anesthesia with the patient or authorized representative who has indicated his/her understanding and acceptance.     Dental Advisory Given  Plan Discussed with: Anesthesiologist, CRNA and Surgeon  Anesthesia Plan Comments: (Patient consented for risks of anesthesia including but not limited to:  - adverse reactions to medications - damage to eyes, teeth, lips or other oral mucosa - nerve damage due to positioning  - sore throat or hoarseness - Damage to heart, brain, nerves, lungs, other parts of body or loss of life  Patient voiced understanding.)        Anesthesia Quick Evaluation

## 2020-10-25 NOTE — ED Provider Notes (Signed)
[facility] Emergency Department Provider Note    Event Date/Time   First MD Initiated Contact with Patient 10/25/20 [phone]     (approximate)  I have reviewed the triage vital signs and the nursing notes.   HISTORY  Chief Complaint Abdominal Pain    HPI Geoffrey Branch is a 70 y.o. male   with the below listed past medical history presents to the ER for evaluation of right lower quadrant pain.  Symptoms started little more than a day ago.  Has a history of kidney stones but this feels different.  Has had some nausea.  Felt like he is febrile this morning but did not check temperature.  Is never had pain like this before.  Does have his appendix.   Past Medical History:  Diagnosis Date  . Coronary artery disease 2004   stent placed  . Dysrhythmia   . Hypercholesteremia   . Hypertension   . Myocardial infarction (HCC)   . Sleep apnea    History reviewed. No pertinent family history. Past Surgical History:  Procedure Laterality Date  . CORONARY STENT PLACEMENT  2004  . EXTRACORPOREAL SHOCK WAVE LITHOTRIPSY Right 06/06/2015   Procedure: EXTRACORPOREAL SHOCK WAVE LITHOTRIPSY (ESWL);  Surgeon: Orson Ape, MD;  Location: [facility] ORS;  Service: Urology;  Laterality: Right;  . LITHOTRIPSY  11/2013   Patient Active Problem List   Diagnosis Date Noted  . Bowel perforation (HCC) 10/25/2020      Prior to Admission medications   Medication Sig Start Date End Date Taking? Authorizing Provider  aspirin 81 MG EC tablet Take 81 mg by mouth daily.   Yes [provider]  glipiZIDE (GLUCOTROL) 5 MG tablet Take 5 mg by mouth in the morning and at bedtime. 12/15/19 09/12/21 Yes [provider]  losartan (COZAAR) 100 MG tablet Take 100 mg by mouth daily. 09/01/20  Yes [provider]  metFORMIN (GLUCOPHAGE-XR) 500 MG 24 hr tablet Take 1,000 mg by mouth daily. 09/06/19 09/12/21 Yes [provider]  metoprolol succinate (TOPROL-XL) 50 MG  24 hr tablet Take 50 mg by mouth daily. 11/30/19  Yes [provider]  pravastatin (PRAVACHOL) 80 MG tablet Take 80 mg by mouth at bedtime. 09/10/20  Yes [provider]  albuterol (PROVENTIL HFA;VENTOLIN HFA) 108 (90 BASE) MCG/ACT inhaler Inhale 2 puffs into the lungs every 6 (six) hours as needed for wheezing or shortness of breath. Patient not taking: Reported on 10/25/2020    [provider]  clopidogrel (PLAVIX) 75 MG tablet Take 75 mg by mouth daily. Patient not taking: Reported on 10/25/2020    [provider]  docusate sodium (COLACE) 100 MG capsule Take 2 capsules (200 mg total) by mouth 2 (two) times daily. Patient not taking: Reported on 10/25/2020 06/06/15   Orson Ape, MD  EPINEPHrine 0.3 mg/0.3 mL IJ SOAJ injection Inject 0.3 mLs into the muscle as directed. Patient not taking: No sig reported 01/23/15   [provider]  levofloxacin (LEVAQUIN) 500 MG tablet Take 1 tablet (500 mg total) by mouth daily. Patient not taking: No sig reported 06/06/15   Orson Ape, MD  losartan (COZAAR) 50 MG tablet Take 50 mg by mouth daily. Patient not taking: Reported on 10/25/2020    [provider]  metoprolol (LOPRESSOR) 50 MG tablet Take 50 mg by mouth daily. Patient not taking: No sig reported    [provider]  nitroGLYCERIN (NITROSTAT) 0.3 MG SL tablet Place 0.3 mg under  the tongue every 5 (five) minutes as needed for chest pain. Patient not taking: No sig reported    [provider]  NUCYNTA 50 MG TABS tablet Take 1 tablet (50 mg total) by mouth every 6 (six) hours as needed for moderate pain. 1 TO 2 TABS Q 6 HOURS PRN PAIN Patient not taking: Reported on 10/25/2020 06/06/15   Orson Ape, MD  ondansetron (ZOFRAN ODT) 8 MG disintegrating tablet Take 1 tablet (8 mg total) by mouth every 6 (six) hours as needed for nausea or vomiting. Patient not taking: Reported on 10/25/2020 06/06/15   Orson Ape, MD   ondansetron (ZOFRAN) 4 MG tablet Take 4 mg by mouth every 8 (eight) hours as needed for nausea or vomiting. Patient not taking: Reported on 10/25/2020    [provider]  oxycodone (OXY-IR) 5 MG capsule Take 5 mg by mouth every 4 (four) hours as needed for pain. Patient not taking: Reported on 10/25/2020    [provider]  pravastatin (PRAVACHOL) 20 MG tablet Take 20 mg by mouth daily. Patient not taking: No sig reported    [provider]  promethazine (PHENERGAN) 25 MG tablet Take 25 mg by mouth every 6 (six) hours as needed for nausea or vomiting. Patient not taking: Reported on 10/25/2020    [provider]  tamsulosin (FLOMAX) 0.4 MG CAPS capsule Take 0.4 mg by mouth daily. Patient not taking: Reported on 10/25/2020    [provider]  tamsulosin (FLOMAX) 0.4 MG CAPS capsule Take 1 capsule (0.4 mg total) by mouth daily. Patient not taking: Reported on 10/25/2020 06/06/15   Orson Ape, MD  tapentadol (NUCYNTA) 50 MG TABS tablet Take 50 mg by mouth every 6 (six) hours as needed for moderate pain. Patient not taking: Reported on 10/25/2020    [provider]  umeclidinium-vilanterol (ANORO ELLIPTA) 62.5-25 MCG/INH AEPB Inhale into the lungs. Patient not taking: Reported on 10/25/2020    [provider]    Allergies Bee venom, Maple flavor, and Wasp venom    Social History Social History   Tobacco Use  . Smoking status: Former Smoker    Packs/day: 1.00    Years: 40.00    Pack years: 40.00    Quit date: 10/17/2002    Years since quitting: 18.0  . Tobacco comment: 40 pack year smoker  Substance Use Topics  . Alcohol use: Yes    Comment: rare alcohol use  . Drug use: No    Review of Systems Patient denies headaches, rhinorrhea, blurry vision, numbness, shortness of breath, chest pain, edema, cough, abdominal pain, nausea, vomiting, diarrhea, dysuria, fevers, rashes or hallucinations unless otherwise stated above in  HPI. ____________________________________________   PHYSICAL EXAM:  VITAL SIGNS: Vitals:   10/25/20 1530 10/25/20 1545  BP: (!) 161/74   Pulse: 75 78  Resp: 18   Temp:    SpO2: 100% 98%    Constitutional: Alert and oriented.  Eyes: Conjunctivae are normal.  Head: Atraumatic. Nose: No congestion/rhinnorhea. Mouth/Throat: Mucous membranes are moist.   Neck: No stridor. Painless ROM.  Cardiovascular: Normal rate, regular rhythm. Grossly normal heart sounds.  Good peripheral circulation. Respiratory: Normal respiratory effort.  No retractions. Lungs CTAB. Gastrointestinal: Soft with mild ttp in RLQ. No distention. No abdominal bruits. No CVA tenderness. Genitourinary:  Musculoskeletal: No lower extremity tenderness nor edema.  No joint effusions. Neurologic:  Normal speech and language. No gross focal neurologic deficits are appreciated. No facial droop Skin:  Skin is warm,  dry and intact. No rash noted. Psychiatric: Mood and affect are normal. Speech and behavior are normal.  ____________________________________________   LABS (all labs ordered are listed, but only abnormal results are displayed)  Results for orders placed or performed during the hospital encounter of 10/25/20 (from the past 24 hour(s))  Lipase, blood     Status: None   Collection Time: 10/25/20  8:57 AM  Result Value Ref Range   Lipase 38 11 - 51 U/L  Comprehensive metabolic panel     Status: Abnormal   Collection Time: 10/25/20  8:57 AM  Result Value Ref Range   Sodium 131 (L) 135 - 145 mmol/L   Potassium 4.5 3.5 - 5.1 mmol/L   Chloride 96 (L) 98 - 111 mmol/L   CO2 24 22 - 32 mmol/L   Glucose, Bld 227 (H) 70 - 99 mg/dL   BUN 10 8 - 23 mg/dL   Creatinine, Ser 4.090.88 0.61 - 1.24 mg/dL   Calcium 9.3 8.9 - 81.110.3 mg/dL   Total Protein 7.7 6.5 - 8.1 g/dL   Albumin 4.2 3.5 - 5.0 g/dL   AST 33 15 - 41 U/L   ALT 57 (H) 0 - 44 U/L   Alkaline Phosphatase 45 38 - 126 U/L   Total Bilirubin 1.4 (H) 0.3 - 1.2  mg/dL   GFR, Estimated >91>60 >47>60 mL/min   Anion gap 11 5 - 15  CBC     Status: Abnormal   Collection Time: 10/25/20  8:57 AM  Result Value Ref Range   WBC 11.5 (H) 4.0 - 10.5 K/uL   RBC 5.03 4.22 - 5.81 MIL/uL   Hemoglobin 14.8 13.0 - 17.0 g/dL   HCT 82.944.5 56.239.0 - 13.052.0 %   MCV 88.5 80.0 - 100.0 fL   MCH 29.4 26.0 - 34.0 pg   MCHC 33.3 30.0 - 36.0 g/dL   RDW 86.513.7 78.411.5 - 69.615.5 %   Platelets 254 150 - 400 K/uL   nRBC 0.0 0.0 - 0.2 %  Urinalysis, Complete w Microscopic     Status: Abnormal   Collection Time: 10/25/20  8:57 AM  Result Value Ref Range   Color, Urine YELLOW (A) YELLOW   APPearance CLEAR (A) CLEAR   Specific Gravity, Urine 1.013 1.005 - 1.030   pH 5.0 5.0 - 8.0   Glucose, UA >=500 (A) NEGATIVE mg/dL   Hgb urine dipstick SMALL (A) NEGATIVE   Bilirubin Urine NEGATIVE NEGATIVE   Ketones, ur NEGATIVE NEGATIVE mg/dL   Protein, ur NEGATIVE NEGATIVE mg/dL   Nitrite NEGATIVE NEGATIVE   Leukocytes,Ua NEGATIVE NEGATIVE   WBC, UA 0-5 0 - 5 WBC/hpf   Bacteria, UA NONE SEEN NONE SEEN   Squamous Epithelial / LPF 0-5 0 - 5   Mucus PRESENT   Lactic acid, plasma     Status: None   Collection Time: 10/25/20 12:20 PM  Result Value Ref Range   Lactic Acid, Venous 1.3 0.5 - 1.9 mmol/L  Resp Panel by RT-PCR (Flu A&B, Covid) Nasopharyngeal Swab     Status: None   Collection Time: 10/25/20 12:36 PM   Specimen: Nasopharyngeal Swab; Nasopharyngeal(NP) swabs in vial transport medium  Result Value Ref Range   SARS Coronavirus 2 by RT PCR NEGATIVE NEGATIVE   Influenza A by PCR NEGATIVE NEGATIVE   Influenza B by PCR NEGATIVE NEGATIVE   ____________________________________________ ____________________________________________  RADIOLOGY  I personally reviewed all radiographic images ordered to evaluate for the above acute complaints and reviewed radiology reports and findings.  These findings were personally discussed with the patient.  Please see medical record for radiology  report.  ____________________________________________   PROCEDURES  Procedure(s) performed:  Procedures    Critical Care performed: no ____________________________________________   INITIAL IMPRESSION / ASSESSMENT AND PLAN / ED COURSE  Pertinent labs & imaging results that were available during my care of the patient were reviewed by me and considered in my medical decision making (see chart for details).   DDX: Appendicitis, stone, pyelonephritis, colitis, diverticulitis  QUENCY TOBER is a 70 y.o. who presents to the ED with presentation as described above.  Patient uncomfortable appearing does have right lower quadrant abdominal pain still has an appendix.  Will give IV fluid as well as IV narcotic medication and order CT scan and reassess.  Clinical Course as of 10/25/20 1552  Fri Oct 25, 2020  1223 Radiology called regarding abnormality on CT.  Based on his presentation I consulted general surgery who is evaluated patient at bedside right now. [PR]    Clinical Course User Index [PR] Willy Eddy, MD   Patient to OR with general surgery for further evaluation management.  The patient was evaluated in Emergency Department today for the symptoms described in the history of present illness. He/she was evaluated in the context of the global COVID-19 pandemic, which necessitated consideration that the patient might be at risk for infection with the SARS-CoV-2 virus that causes COVID-19. Institutional protocols and algorithms that pertain to the evaluation of patients at risk for COVID-19 are in a state of rapid change based on information released by regulatory bodies including the CDC and federal and state organizations. These policies and algorithms were followed during the patient's care in the ED.  As part of my medical decision making, I reviewed the following data within the electronic MEDICAL RECORD NUMBER Nursing notes reviewed and incorporated, Labs reviewed, notes from  prior ED visits and [place] Controlled Substance Database   ____________________________________________   FINAL CLINICAL IMPRESSION(S) / ED DIAGNOSES  Final diagnoses:  RLQ abdominal pain  Pneumatosis coli      NEW MEDICATIONS STARTED DURING THIS VISIT:  New Prescriptions   No medications on file     Note:  This document was prepared using Dragon voice recognition software and may include unintentional dictation errors.    Willy Eddy, MD 10/25/20 [phone]

## 2020-10-25 NOTE — ED Notes (Signed)
Report given to OR staff.

## 2020-10-25 NOTE — ED Notes (Signed)
This RN attempted x 2 for IV without success.  

## 2020-10-25 NOTE — Transfer of Care (Signed)
Immediate Anesthesia Transfer of Care Note  Patient: Geoffrey Branch  Procedure(s) Performed: XI ROBOT ASSISTED DIAGNOSTIC LAPAROSCOPY, WITH HAND ASSISTED SMALL BOWEL RESECTION AND ANASTOMOSIS (N/A )  Patient Location: PACU  Anesthesia Type:General  Level of Consciousness: drowsy  Airway & Oxygen Therapy: Patient Spontanous Breathing and Patient connected to face mask oxygen  Post-op Assessment: Report given to RN and Post -op Vital signs reviewed and stable  Post vital signs: Reviewed and stable  Last Vitals:  Vitals Value Taken Time  BP 144/75 10/25/20 1900  Temp    Pulse 92 10/25/20 1901  Resp 13 10/25/20 1901  SpO2 90 % 10/25/20 1901  Vitals shown include unvalidated device data.  Last Pain:  Vitals:   10/25/20 1602  TempSrc: Oral  PainSc:          Complications: No complications documented.

## 2020-10-25 NOTE — Anesthesia Postprocedure Evaluation (Signed)
Anesthesia Post Note  Patient: Geoffrey Branch  Procedure(s) Performed: XI ROBOT ASSISTED DIAGNOSTIC LAPAROSCOPY, WITH HAND ASSISTED SMALL BOWEL RESECTION AND ANASTOMOSIS (N/A )  Patient location during evaluation: PACU Anesthesia Type: General Level of consciousness: awake and alert Pain management: pain level controlled Vital Signs Assessment: post-procedure vital signs reviewed and stable Respiratory status: spontaneous breathing, nonlabored ventilation, respiratory function stable and patient connected to nasal cannula oxygen Cardiovascular status: blood pressure returned to baseline and stable Postop Assessment: no apparent nausea or vomiting Anesthetic complications: no   No complications documented.   Last Vitals:  Vitals:   10/25/20 1945 10/25/20 2016  BP: 132/72 118/72  Pulse: 89 83  Resp: 18 18  Temp: 36.8 C (!) 36.3 C  SpO2: 95% 96%    Last Pain:  Vitals:   10/25/20 2016  TempSrc: Oral  PainSc:                  Cleda Mccreedy Casara Perrier

## 2020-10-25 NOTE — ED Triage Notes (Signed)
Pt to ED via POV c/o RLQ abdominal pain that started yesterday. Pt states that the pain was worse earlier today but has eased off some. Pt denies vomiting or diarrhea. Pt states that he has had nausea. Pt states that he was sweating this morning and thinks he had a fever but he did not check his temperature. Pt is in NAD.

## 2020-10-25 NOTE — ED Notes (Signed)
RN aware of bed assignment 

## 2020-10-26 ENCOUNTER — Encounter: Payer: Self-pay | Admitting: General Surgery

## 2020-10-26 LAB — CBC
HCT: 41.2 % (ref 39.0–52.0)
Hemoglobin: 13.8 g/dL (ref 13.0–17.0)
MCH: 29.7 pg (ref 26.0–34.0)
MCHC: 33.5 g/dL (ref 30.0–36.0)
MCV: 88.8 fL (ref 80.0–100.0)
Platelets: 240 10*3/uL (ref 150–400)
RBC: 4.64 MIL/uL (ref 4.22–5.81)
RDW: 14 % (ref 11.5–15.5)
WBC: 13 10*3/uL — ABNORMAL HIGH (ref 4.0–10.5)
nRBC: 0 % (ref 0.0–0.2)

## 2020-10-26 LAB — BASIC METABOLIC PANEL
Anion gap: 10 (ref 5–15)
BUN: 10 mg/dL (ref 8–23)
CO2: 23 mmol/L (ref 22–32)
Calcium: 8.2 mg/dL — ABNORMAL LOW (ref 8.9–10.3)
Chloride: 101 mmol/L (ref 98–111)
Creatinine, Ser: 0.84 mg/dL (ref 0.61–1.24)
GFR, Estimated: >60 mL/min (ref >60)
Glucose, Bld: 209 mg/dL — ABNORMAL HIGH (ref 70–99)
Potassium: 4.1 mmol/L (ref 3.5–5.1)
Sodium: 134 mmol/L — ABNORMAL LOW (ref 135–145)

## 2020-10-26 LAB — GLUCOSE, CAPILLARY
Glucose-Capillary: 170 mg/dL — ABNORMAL HIGH (ref 70–99)
Glucose-Capillary: 178 mg/dL — ABNORMAL HIGH (ref 70–99)
Glucose-Capillary: 97 mg/dL (ref 70–99)
Glucose-Capillary: 126 mg/dL — ABNORMAL HIGH (ref 70–99)

## 2020-10-26 LAB — PHOSPHORUS: Phosphorus: 2.9 mg/dL (ref 2.5–4.6)

## 2020-10-26 LAB — HIV ANTIBODY (ROUTINE TESTING W REFLEX): HIV Screen 4th Generation wRfx: NONREACTIVE

## 2020-10-26 LAB — HEMOGLOBIN A1C
Hgb A1c MFr Bld: 6.6 % — ABNORMAL HIGH (ref 4.8–5.6)
Mean Plasma Glucose: 142.72 mg/dL

## 2020-10-26 LAB — MAGNESIUM: Magnesium: 1.9 mg/dL (ref 1.7–2.4)

## 2020-10-26 MED ORDER — CHLORHEXIDINE GLUCONATE CLOTH 2 % EX PADS
6.0000 | MEDICATED_PAD | Freq: Every day | CUTANEOUS | Status: DC
  Administered 2020-10-26 – 2020-10-27 (×2): 6 via TOPICAL

## 2020-10-26 NOTE — Progress Notes (Signed)
Patient ID: Geoffrey Branch, male   DOB: Feb 24, 1951, 70 y.o.   MRN: [id number]     SURGICAL PROGRESS NOTE   Hospital Day(s): 1.   Post op day(s): 1 Day Post-Op.   Interval History: Patient seen and examined, no acute events or new complaints overnight. Patient reports feeling well.  He reports that the pain is controlled.  He reports that he still not passing gas.  He denies any bowel movement yet.  He denies any nausea or vomiting.  He enjoyed a clear liquid breakfast.  Vital signs in last 24 hours: [min-max] current  Temp:  [97.4 F (36.3 C)-99.1 F (37.3 C)] 97.6 F (36.4 C) (04/30 0439) Pulse Rate:  [69-99] 72 (04/30 0439) Resp:  [14-20] 20 (04/30 0439) BP: (118-171)/(45-109) 158/78 (04/30 0439) SpO2:  [92 %-100 %] 95 % (04/30 0439)     Height: 5\' 7"  (170.2 cm) Weight: 95.3 kg BMI (Calculated): 32.88   Physical Exam:  Constitutional: alert, cooperative and no distress  Respiratory: breathing non-labored at rest  Cardiovascular: regular rate and sinus rhythm  Gastrointestinal: soft, non-tender, and non-distended wounds are dry and clean.  Labs:  CBC Latest Ref Rng & Units 10/26/2020 10/25/2020 11/09/2011  WBC 4.0 - 10.5 K/uL 13.0(H) 11.5(H) 6.7  Hemoglobin 13.0 - 17.0 g/dL 11/11/2011 64.3 32.9  Hematocrit 39.0 - 52.0 % 41.2 44.5 45.8  Platelets 150 - 400 K/uL 240 254 223   CMP Latest Ref Rng & Units 10/26/2020 10/25/2020 11/09/2011  Glucose 70 - 99 mg/dL 11/11/2011) 841(Y) 91  BUN 8 - 23 mg/dL 10 10 10   Creatinine 0.61 - 1.24 mg/dL 606(T 0.16  Sodium 135 - 145 mmol/L 134(L) 131(L) 138  Potassium 3.5 - 5.1 mmol/L 4.1 4.5 4.0  Chloride 98 - 111 mmol/L 101 96(L) 102  CO2 22 - 32 mmol/L 23 24 27   Calcium 8.9 - 10.3 mg/dL 8.2(L) 9.3 8.3(L)  Total Protein 6.5 - 8.1 g/dL - 7.7 7.7  Total Bilirubin 0.3 - 1.2 mg/dL - 0.10) 0.6  Alkaline Phos 38 - 126 U/L - 45 63  AST 15 - 41 U/L - 33 39(H)  ALT 0 - 44 U/L - 57(H) 48    Imaging studies: No new pertinent imaging studies   Assessment/Plan:   70 y.o. male with small bowel perforation1 Day Post-Op s/p hand-assisted small bowel resection with anastomosis  Patient recovering slowly but adequately.  Still with ileus.  Ileus expected postop.  No sign of complication from surgery at this moment.  We will continue with clear liquid diet.  I encouraged the patient to ambulate.  We will discontinue Foley catheter.     , MD

## 2020-10-26 NOTE — Plan of Care (Signed)
Continuing with plan of care. 

## 2020-10-26 NOTE — Progress Notes (Signed)
A consult had been placed to IV Therapy for a new iv site; current site is in the RAC;  Per pt , he is a hard stick, and anesthesia had to place the iv he has now;  IV antibiotic is currently infusing with no alarms from the pump;  Pt is very reluctant to have the site changed since it was so difficult to get yesterday; he is willing to keep his arm fairly straight to keep the pump from alarming;  Pt's RN also in the room at the time;  Told pt if he changed his mind, I would assess with the ultrasound.  Pt wanting to get up in the chair or go for a walk.  Will keep current IV,  per pt request,  at this time.

## 2020-10-27 LAB — GLUCOSE, CAPILLARY
Glucose-Capillary: 181 mg/dL — ABNORMAL HIGH (ref 70–99)
Glucose-Capillary: 173 mg/dL — ABNORMAL HIGH (ref 70–99)
Glucose-Capillary: 119 mg/dL — ABNORMAL HIGH (ref 70–99)
Glucose-Capillary: 134 mg/dL — ABNORMAL HIGH (ref 70–99)

## 2020-10-27 MED ORDER — ZOLPIDEM TARTRATE 5 MG PO TABS
5.0000 mg | ORAL_TABLET | Freq: Every evening | ORAL | Status: DC | PRN
  Administered 2020-10-27: 5 mg via ORAL
  Filled 2020-10-27 (×2): qty 1

## 2020-10-27 NOTE — Progress Notes (Signed)
Patient ID: Geoffrey Branch, male   DOB: August 15, 1950, 70 y.o.   MRN: [id number]     SURGICAL PROGRESS NOTE   Hospital Day(s): 2.   Post op day(s): 2 Days Post-Op.   Interval History: Patient seen and examined, no acute events or new complaints overnight. Patient reports feeling more comfortable compared to yesterday.  He did not have a good time sleep.  He feels distended.  He denies nausea or vomiting.  He denies passing gas.  Vital signs in last 24 hours: [min-max] current  Temp:  [98 F (36.7 C)-98.2 F (36.8 C)] 98 F (36.7 C) (05/01 0423) Pulse Rate:  [59-72] 72 (05/01 0423) Resp:  [16-20] 20 (05/01 0423) BP: (142-171)/(67-87) 171/87 (05/01 0423) SpO2:  [93 %-99 %] 93 % (05/01 0423)     Height: 5\' 7"  (170.2 cm) Weight: 95.3 kg BMI (Calculated): 32.88   Physical Exam:  Constitutional: alert, cooperative and no distress  Respiratory: breathing non-labored at rest  Cardiovascular: regular rate and sinus rhythm  Gastrointestinal: soft, tender, and distended.  Wounds are dry and clean.  Labs:  CBC Latest Ref Rng & Units 10/26/2020 10/25/2020 11/09/2011  WBC 4.0 - 10.5 K/uL 13.0(H) 11.5(H) 6.7  Hemoglobin 13.0 - 17.0 g/dL 11/11/2011 67.8 93.8  Hematocrit 39.0 - 52.0 % 41.2 44.5 45.8  Platelets 150 - 400 K/uL 240 254 223   CMP Latest Ref Rng & Units 10/26/2020 10/25/2020 11/09/2011  Glucose 70 - 99 mg/dL 11/11/2011) 751(W) 91  BUN 8 - 23 mg/dL 10 10 10   Creatinine 0.61 - 1.24 mg/dL 258(N 2.77  Sodium 135 - 145 mmol/L 134(L) 131(L) 138  Potassium 3.5 - 5.1 mmol/L 4.1 4.5 4.0  Chloride 98 - 111 mmol/L 101 96(L) 102  CO2 22 - 32 mmol/L 23 24 27   Calcium 8.9 - 10.3 mg/dL 8.2(L) 9.3 8.3(L)  Total Protein 6.5 - 8.1 g/dL - 7.7 7.7  Total Bilirubin 0.3 - 1.2 mg/dL - 8.24) 0.6  Alkaline Phos 38 - 126 U/L - 45 63  AST 15 - 41 U/L - 33 39(H)  ALT 0 - 44 U/L - 57(H) 48    Imaging studies: No new pertinent imaging studies   Assessment/Plan:  70 y.o. male with small bowel perforation 2 Day Post-Op  s/p hand-assisted small bowel resection with anastomosis.  Patient today feeling more bloated and uncomfortable.  This is most likely expected postoperative ileus.  I would not advance his diet.  I encouraged the patient to ambulate.  We will continue patient with IV hydration.  We will continue with pain management.  Vital signs are stable without any fevers.  Patient understand that if the ileus prolongs and he developed nausea or vomiting he will need a nasogastric tube.  Will monitor closely.   , MD

## 2020-10-27 NOTE — Plan of Care (Signed)
Continuing with plan of care. 

## 2020-10-28 LAB — CBC
HCT: 43.9 % (ref 39.0–52.0)
Hemoglobin: 14.4 g/dL (ref 13.0–17.0)
MCH: 29.3 pg (ref 26.0–34.0)
MCHC: 32.8 g/dL (ref 30.0–36.0)
MCV: 89.2 fL (ref 80.0–100.0)
Platelets: 270 10*3/uL (ref 150–400)
RBC: 4.92 MIL/uL (ref 4.22–5.81)
RDW: 13.8 % (ref 11.5–15.5)
WBC: 9.9 10*3/uL (ref 4.0–10.5)
nRBC: 0 % (ref 0.0–0.2)

## 2020-10-28 LAB — MAGNESIUM: Magnesium: 2 mg/dL (ref 1.7–2.4)

## 2020-10-28 LAB — BASIC METABOLIC PANEL
Anion gap: 8 (ref 5–15)
BUN: 13 mg/dL (ref 8–23)
CO2: 25 mmol/L (ref 22–32)
Calcium: 8.1 mg/dL — ABNORMAL LOW (ref 8.9–10.3)
Chloride: 101 mmol/L (ref 98–111)
Creatinine, Ser: 0.86 mg/dL (ref 0.61–1.24)
GFR, Estimated: >60 mL/min (ref >60)
Glucose, Bld: 133 mg/dL — ABNORMAL HIGH (ref 70–99)
Potassium: 3.9 mmol/L (ref 3.5–5.1)
Sodium: 134 mmol/L — ABNORMAL LOW (ref 135–145)

## 2020-10-28 LAB — GLUCOSE, CAPILLARY
Glucose-Capillary: 169 mg/dL — ABNORMAL HIGH (ref 70–99)
Glucose-Capillary: 91 mg/dL (ref 70–99)
Glucose-Capillary: 98 mg/dL (ref 70–99)
Glucose-Capillary: 131 mg/dL — ABNORMAL HIGH (ref 70–99)

## 2020-10-28 LAB — PHOSPHORUS: Phosphorus: 2.8 mg/dL (ref 2.5–4.6)

## 2020-10-28 MED ORDER — ASPIRIN EC 81 MG PO TBEC
81.0000 mg | DELAYED_RELEASE_TABLET | Freq: Every day | ORAL | Status: DC
  Administered 2020-10-28 – 2020-10-31 (×4): 81 mg via ORAL
  Filled 2020-10-28 (×4): qty 1

## 2020-10-28 NOTE — Care Management Important Message (Signed)
Important Message  Patient Details  Name: Geoffrey Branch MRN: [id number] Date of Birth: Apr 17, 1951   Medicare Important Message Given:  Yes     [name] 10/28/2020, 11:07 AM

## 2020-10-28 NOTE — Progress Notes (Signed)
Patient ID: Geoffrey Branch, male   DOB: 07-27-1950, 70 y.o.   MRN: [id number]     SURGICAL PROGRESS NOTE   Hospital Day(s): 3.   Post op day(s): 3 Days Post-Op.   Interval History: Patient seen and examined, no acute events or new complaints overnight. Patient reports feeling well. Reports continue to improve slowly.  Denies passing gas but denies nausea or vomiting.  Vital signs in last 24 hours: [min-max] current  Temp:  [97.5 F (36.4 C)-98.1 F (36.7 C)] 98.1 F (36.7 C) (05/02 1113) Pulse Rate:  [66-74] 70 (05/02 1113) Resp:  [16-20] 17 (05/02 1113) BP: (130-173)/(83-98) 130/98 (05/02 1113) SpO2:  [96 %-98 %] 98 % (05/02 1113)     Height: 5\' 7"  (170.2 cm) Weight: 95.3 kg BMI (Calculated): 32.88   Physical Exam:  Constitutional: alert, cooperative and no distress  Respiratory: breathing non-labored at rest  Cardiovascular: regular rate and sinus rhythm  Gastrointestinal: soft, non-tender, and non-distended  Labs:  CBC Latest Ref Rng & Units 10/28/2020 10/26/2020 10/25/2020  WBC 4.0 - 10.5 K/uL 9.9 13.0(H) 11.5(H)  Hemoglobin 13.0 - 17.0 g/dL 10/27/2020 81.8 29.9  Hematocrit 39.0 - 52.0 % 43.9 41.2 44.5  Platelets 150 - 400 K/uL 270 240 254   CMP Latest Ref Rng & Units 10/28/2020 10/26/2020 10/25/2020  Glucose 70 - 99 mg/dL 10/27/2020) 696(V) 893(Y)  BUN 8 - 23 mg/dL 13 10 10   Creatinine 0.61 - 1.24 mg/dL 101(B 5.10  Sodium 135 - 145 mmol/L 134(L) 134(L) 131(L)  Potassium 3.5 - 5.1 mmol/L 3.9 4.1 4.5  Chloride 98 - 111 mmol/L 101 101 96(L)  CO2 22 - 32 mmol/L 25 23 24   Calcium 8.9 - 10.3 mg/dL 8.1(L) 8.2(L) 9.3  Total Protein 6.5 - 8.1 g/dL - - 7.7  Total Bilirubin 0.3 - 1.2 mg/dL - - 1.4(H)  Alkaline Phos 38 - 126 U/L - - 45  AST 15 - 41 U/L - - 33  ALT 0 - 44 U/L - - 57(H)    Imaging studies: No new pertinent imaging studies   Assessment/Plan:  70 y.o.malewith small bowel perforation 3 Day Post-Ops/p hand-assisted small bowel resection with anastomosis.  Patient recovering  slowly.  Today no clinical deterioration.  Today no nausea or vomiting.  Pain controlled.  Still not passing gas.  I will advance him to full liquids.  Vital signs stable with no fever.  White blood cell trending down.  Electrolytes within normal limits.  Patient ambulating.  We will continue with current management.  5.27, MD

## 2020-10-29 LAB — SURGICAL PATHOLOGY

## 2020-10-29 LAB — GLUCOSE, CAPILLARY
Glucose-Capillary: 130 mg/dL — ABNORMAL HIGH (ref 70–99)
Glucose-Capillary: 101 mg/dL — ABNORMAL HIGH (ref 70–99)
Glucose-Capillary: 88 mg/dL (ref 70–99)

## 2020-10-29 MED ORDER — LOSARTAN POTASSIUM 50 MG PO TABS
100.0000 mg | ORAL_TABLET | Freq: Every day | ORAL | Status: DC
  Administered 2020-10-30 – 2020-10-31 (×2): 100 mg via ORAL
  Filled 2020-10-29 (×2): qty 2

## 2020-10-29 MED ORDER — LOSARTAN POTASSIUM 50 MG PO TABS
50.0000 mg | ORAL_TABLET | Freq: Once | ORAL | Status: AC
  Administered 2020-10-29: 50 mg via ORAL
  Filled 2020-10-29: qty 1

## 2020-10-29 MED ORDER — SODIUM CHLORIDE 0.9 % IV SOLN
25.0000 mg | Freq: Once | INTRAVENOUS | Status: AC
  Administered 2020-10-29: 25 mg via INTRAVENOUS
  Filled 2020-10-29: qty 1

## 2020-10-29 NOTE — Progress Notes (Signed)
Patient ID: Geoffrey Branch, male   DOB: 1951-04-18, 70 y.o.   MRN: [id number]     SURGICAL PROGRESS NOTE   Hospital Day(s): 4.   Post op day(s): 4 Days Post-Op.   Interval History: Patient seen and examined, no acute events or new complaints overnight. Patient reports feeling nauseated. Reports is also passing gas rectally. Denies significant pain.  Vital signs in last 24 hours: [min-max] current  Temp:  [97.8 F (36.6 C)-98.2 F (36.8 C)] 98.2 F (36.8 C) (05/03 0731) Pulse Rate:  [57-72] 65 (05/03 0731) Resp:  [16-18] 16 (05/03 0731) BP: (130-177)/(75-98) 177/83 (05/03 0731) SpO2:  [95 %-99 %] 96 % (05/03 0731)     Height: 5\' 7"  (170.2 cm) Weight: 95.3 kg BMI (Calculated): 32.88   Physical Exam:  Constitutional: alert, cooperative and no distress  Respiratory: breathing non-labored at rest  Cardiovascular: regular rate and sinus rhythm  Gastrointestinal: soft, non-tender, and non-distended  Labs:  CBC Latest Ref Rng & Units 10/28/2020 10/26/2020 10/25/2020  WBC 4.0 - 10.5 K/uL 9.9 13.0(H) 11.5(H)  Hemoglobin 13.0 - 17.0 g/dL 10/27/2020 17.0 01.7  Hematocrit 39.0 - 52.0 % 43.9 41.2 44.5  Platelets 150 - 400 K/uL 270 240 254   CMP Latest Ref Rng & Units 10/28/2020 10/26/2020 10/25/2020  Glucose 70 - 99 mg/dL 10/27/2020) 496(P) 591(M)  BUN 8 - 23 mg/dL 13 10 10   Creatinine 0.61 - 1.24 mg/dL 384(Y 6.59  Sodium 135 - 145 mmol/L 134(L) 134(L) 131(L)  Potassium 3.5 - 5.1 mmol/L 3.9 4.1 4.5  Chloride 98 - 111 mmol/L 101 101 96(L)  CO2 22 - 32 mmol/L 25 23 24   Calcium 8.9 - 10.3 mg/dL 8.1(L) 8.2(L) 9.3  Total Protein 6.5 - 8.1 g/dL - - 7.7  Total Bilirubin 0.3 - 1.2 mg/dL - - 1.4(H)  Alkaline Phos 38 - 126 U/L - - 45  AST 15 - 41 U/L - - 33  ALT 0 - 44 U/L - - 57(H)    Imaging studies: No new pertinent imaging studies   Assessment/Plan:  70 y.o.malewith small bowel perforation4Day Post-Ops/p hand-assisted small bowel resection with anastomosis.  Patient today with nausea. Vital  signs and physical exam are reassuring. Also passing gas rectally. Abdomen is soft and non tender to palpation. Drain with serous output. Will continue with conservative management, antinausea medications, pain control. Patient will like to ambulate again. If no improvement of his symptoms will consider CT scan. Labs yesterday without leukocytosis. No post op fever. Will continue with DVT prophylaxis.   7.01, MD

## 2020-10-30 LAB — GLUCOSE, CAPILLARY
Glucose-Capillary: 108 mg/dL — ABNORMAL HIGH (ref 70–99)
Glucose-Capillary: 97 mg/dL (ref 70–99)
Glucose-Capillary: 79 mg/dL (ref 70–99)
Glucose-Capillary: 93 mg/dL (ref 70–99)

## 2020-10-30 NOTE — Progress Notes (Signed)
Patient ID: Geoffrey Branch, male   DOB: [birth year], 70 y.o.   MRN: [id number]     SURGICAL PROGRESS NOTE   Hospital Day(s): 5.   Post op day(s): 5 Days Post-Op.   Interval History: Patient seen and examined, no acute events or new complaints overnight. Patient reports feeling well.  He denies nausea or vomiting.  He denies any pain today.  Vital signs in last 24 hours: [min-max] current  Temp:  [97.6 F (36.4 C)-98.6 F (37 C)] 97.9 F (36.6 C) (05/04 1625) Pulse Rate:  [52-62] 62 (05/04 1625) Resp:  [18] 18 (05/04 1625) BP: (148-164)/(69-82) 154/77 (05/04 1625) SpO2:  [93 %-99 %] 98 % (05/04 1625)     Height: 5\' 7"  (170.2 cm) Weight: 95.3 kg BMI (Calculated): 32.88   Physical Exam:  Constitutional: alert, cooperative and no distress  Respiratory: breathing non-labored at rest  Cardiovascular: regular rate and sinus rhythm  Gastrointestinal: soft, non-tender, and non-distended.  Wounds are dry and clean.  Labs:  CBC Latest Ref Rng & Units 10/28/2020 10/26/2020 10/25/2020  WBC 4.0 - 10.5 K/uL 9.9 13.0(H) 11.5(H)  Hemoglobin 13.0 - 17.0 g/dL 10/27/2020 87.8 67.6  Hematocrit 39.0 - 52.0 % 43.9 41.2 44.5  Platelets 150 - 400 K/uL 270 240 254   CMP Latest Ref Rng & Units 10/28/2020 10/26/2020 10/25/2020  Glucose 70 - 99 mg/dL 10/27/2020) 947(S) 962(E)  BUN 8 - 23 mg/dL 13 10 10   Creatinine 0.61 - 1.24 mg/dL 366(Q 9.47  Sodium 135 - 145 mmol/L 134(L) 134(L) 131(L)  Potassium 3.5 - 5.1 mmol/L 3.9 4.1 4.5  Chloride 98 - 111 mmol/L 101 101 96(L)  CO2 22 - 32 mmol/L 25 23 24   Calcium 8.9 - 10.3 mg/dL 8.1(L) 8.2(L) 9.3  Total Protein 6.5 - 8.1 g/dL - - 7.7  Total Bilirubin 0.3 - 1.2 mg/dL - - 1.4(H)  Alkaline Phos 38 - 126 U/L - - 45  AST 15 - 41 U/L - - 33  ALT 0 - 44 U/L - - 57(H)    Imaging studies: No new pertinent imaging studies   Assessment/Plan:  69 y.o.malewith small bowel perforation5Day Post-Ops/p hand-assisted small bowel resection with anastomosis.  Patient today continue  without clinical deterioration.  No nausea or vomiting.  Advance diet to full liquids.  Encourage the patient to ambulate.  We will continue with pain management.  6.50, MD

## 2020-10-30 NOTE — Plan of Care (Signed)
  Problem: Clinical Measurements: Goal: Ability to maintain clinical measurements within normal limits will improve Outcome: Progressing Goal: Will remain free from infection Outcome: Progressing Goal: Diagnostic test results will improve Outcome: Progressing Goal: Respiratory complications will improve Outcome: Progressing Goal: Cardiovascular complication will be avoided Outcome: Progressing   Problem: Pain Managment: Goal: General experience of comfort will improve Outcome: Progressing   Pt is involved in and agrees with the plan of care. Afebrile, bradycardic and SBP at 160s. No complaints of pain or nausea at this time. Passing gas but no BM yet. Voiding well. JP drain in place.

## 2020-10-31 LAB — GLUCOSE, CAPILLARY: Glucose-Capillary: 95 mg/dL (ref 70–99)

## 2020-10-31 NOTE — Discharge Summary (Signed)
Patient ID: Geoffrey Branch MRN: [id number] DOB/AGE: Aug 26, 1950 70 y.o.  Admit date: 10/25/2020 Discharge date: 10/31/2020   Discharge Diagnoses:  Active Problems:   Bowel perforation [facility])   Procedures: Laparoscopic hand assisted small bowel resection  Hospital Course: Patient presented with small bowel perforation found on CT scan. He underwent surgical management and small bowel resection was done. Patient has been recovering adequately. Today tolerating soft diet. Having bowel movement. Wound dry and clean. No fever during admission. Ambulating. Pain controlled.   Physical Exam Constitutional:      Appearance: He is well-developed. He is obese.  Cardiovascular:     Rate and Rhythm: Normal rate and regular rhythm.  Pulmonary:     Effort: Pulmonary effort is normal.     Breath sounds: Normal breath sounds.  Abdominal:     General: Abdomen is flat. Bowel sounds are normal.     Palpations: Abdomen is soft.  Neurological:     Mental Status: He is alert.   Wound dry and clean.    Consults: None  Disposition: Discharge disposition: 01-Home or Self Care       Discharge Instructions    Diet - low sodium heart healthy   Complete by: As directed    Increase activity slowly   Complete by: As directed      Allergies as of 10/31/2020      Reactions   Bee Venom Swelling   Maple Flavor Rash, Other (See Comments)   Wasp Venom Other (See Comments), Rash      Medication List    TAKE these medications   albuterol 108 (90 Base) MCG/ACT inhaler Commonly known as: VENTOLIN HFA Inhale 2 puffs into the lungs every 6 (six) hours as needed for wheezing or shortness of breath.   aspirin 81 MG EC tablet Take 81 mg by mouth daily.   clopidogrel 75 MG tablet Commonly known as: PLAVIX Take 75 mg by mouth daily.   docusate sodium 100 MG capsule Commonly known as: COLACE Take 2 capsules (200 mg total) by mouth 2 (two) times daily.   EPINEPHrine 0.3 mg/0.3 mL Soaj  injection Commonly known as: EPI-PEN Inject 0.3 mLs into the muscle as directed.   glipiZIDE 5 MG tablet Commonly known as: GLUCOTROL Take 5 mg by mouth in the morning and at bedtime.   levofloxacin 500 MG tablet Commonly known as: Levaquin Take 1 tablet (500 mg total) by mouth daily.   losartan 50 MG tablet Commonly known as: COZAAR Take 50 mg by mouth daily.   losartan 100 MG tablet Commonly known as: COZAAR Take 100 mg by mouth daily.   metFORMIN 500 MG 24 hr tablet Commonly known as: GLUCOPHAGE-XR Take 1,000 mg by mouth daily.   metoprolol succinate 50 MG 24 hr tablet Commonly known as: TOPROL-XL Take 50 mg by mouth daily.   metoprolol tartrate 50 MG tablet Commonly known as: LOPRESSOR Take 50 mg by mouth daily.   nitroGLYCERIN 0.3 MG SL tablet Commonly known as: NITROSTAT Place 0.3 mg under the tongue every 5 (five) minutes as needed for chest pain.   ondansetron 4 MG tablet Commonly known as: ZOFRAN Take 4 mg by mouth every 8 (eight) hours as needed for nausea or vomiting.   ondansetron 8 MG disintegrating tablet Commonly known as: Zofran ODT Take 1 tablet (8 mg total) by mouth every 6 (six) hours as needed for nausea or vomiting.   oxycodone 5 MG capsule Commonly known as: OXY-IR Take 5 mg by mouth every 4 (  four) hours as needed for pain.   pravastatin 20 MG tablet Commonly known as: PRAVACHOL Take 20 mg by mouth daily.   pravastatin 80 MG tablet Commonly known as: PRAVACHOL Take 80 mg by mouth at bedtime.   promethazine 25 MG tablet Commonly known as: PHENERGAN Take 25 mg by mouth every 6 (six) hours as needed for nausea or vomiting.   tamsulosin 0.4 MG Caps capsule Commonly known as: FLOMAX Take 0.4 mg by mouth daily.   tamsulosin 0.4 MG Caps capsule Commonly known as: FLOMAX Take 1 capsule (0.4 mg total) by mouth daily.   tapentadol 50 MG tablet Commonly known as: NUCYNTA Take 50 mg by mouth every 6 (six) hours as needed for moderate  pain.   Nucynta 50 MG tablet Generic drug: tapentadol Take 1 tablet (50 mg total) by mouth every 6 (six) hours as needed for moderate pain. 1 TO 2 TABS Q 6 HOURS PRN PAIN   umeclidinium-vilanterol 62.5-25 MCG/INH Aepb Commonly known as: ANORO ELLIPTA Inhale into the lungs.       Follow-up Information    Carolan Shiver, MD Follow up on 11/19/2020.   Specialty: General Surgery Why: Post op, small bowel resection Contact information: [address] [place] [address] [phone]

## 2020-10-31 NOTE — Discharge Instructions (Signed)
Diet: Resume home heart healthy regular diet.  ° °Activity: No heavy lifting >20 pounds (children, pets, laundry, garbage) or strenuous activity until follow-up, but light activity and walking are encouraged. Do not drive or drink alcohol if taking narcotic pain medications. ° °Wound care: May shower with soapy water and pat dry (do not rub incisions), but no baths or submerging incision underwater until follow-up. (no swimming)  ° °Medications: Resume all home medications. For mild to moderate pain: acetaminophen (Tylenol) or ibuprofen (if no kidney disease). Combining Tylenol with alcohol can substantially increase your risk of causing liver disease.  ° °Call office ([phone]) at any time if any questions, worsening pain, fevers/chills, bleeding, drainage from incision site, or other concerns. ° °

## 2021-02-12 DIAGNOSIS — H35033 Hypertensive retinopathy, bilateral: Secondary | ICD-10-CM | POA: Diagnosis not present

## 2021-02-13 DIAGNOSIS — E782 Mixed hyperlipidemia: Secondary | ICD-10-CM | POA: Diagnosis not present

## 2021-02-13 DIAGNOSIS — I2510 Atherosclerotic heart disease of native coronary artery without angina pectoris: Secondary | ICD-10-CM | POA: Diagnosis not present

## 2021-02-13 DIAGNOSIS — E119 Type 2 diabetes mellitus without complications: Secondary | ICD-10-CM | POA: Diagnosis not present

## 2021-02-13 DIAGNOSIS — I10 Essential (primary) hypertension: Secondary | ICD-10-CM | POA: Diagnosis not present

## 2021-02-13 DIAGNOSIS — G4733 Obstructive sleep apnea (adult) (pediatric): Secondary | ICD-10-CM | POA: Diagnosis not present

## 2021-02-13 DIAGNOSIS — I252 Old myocardial infarction: Secondary | ICD-10-CM | POA: Diagnosis not present

## 2021-03-18 DIAGNOSIS — Z125 Encounter for screening for malignant neoplasm of prostate: Secondary | ICD-10-CM | POA: Diagnosis not present

## 2021-03-18 DIAGNOSIS — I252 Old myocardial infarction: Secondary | ICD-10-CM | POA: Diagnosis not present

## 2021-03-18 DIAGNOSIS — G4733 Obstructive sleep apnea (adult) (pediatric): Secondary | ICD-10-CM | POA: Diagnosis not present

## 2021-03-18 DIAGNOSIS — Z1211 Encounter for screening for malignant neoplasm of colon: Secondary | ICD-10-CM | POA: Diagnosis not present

## 2021-03-18 DIAGNOSIS — Z0000 Encounter for general adult medical examination without abnormal findings: Secondary | ICD-10-CM | POA: Diagnosis not present

## 2021-03-18 DIAGNOSIS — I2510 Atherosclerotic heart disease of native coronary artery without angina pectoris: Secondary | ICD-10-CM | POA: Diagnosis not present

## 2021-03-18 DIAGNOSIS — Z87442 Personal history of urinary calculi: Secondary | ICD-10-CM | POA: Diagnosis not present

## 2021-03-18 DIAGNOSIS — I10 Essential (primary) hypertension: Secondary | ICD-10-CM | POA: Diagnosis not present

## 2021-03-18 DIAGNOSIS — E119 Type 2 diabetes mellitus without complications: Secondary | ICD-10-CM | POA: Diagnosis not present

## 2021-03-18 DIAGNOSIS — Z23 Encounter for immunization: Secondary | ICD-10-CM | POA: Diagnosis not present

## 2021-03-18 DIAGNOSIS — E782 Mixed hyperlipidemia: Secondary | ICD-10-CM | POA: Diagnosis not present

## 2021-03-27 DIAGNOSIS — Z1211 Encounter for screening for malignant neoplasm of colon: Secondary | ICD-10-CM | POA: Diagnosis not present

## 2021-05-07 DIAGNOSIS — L570 Actinic keratosis: Secondary | ICD-10-CM | POA: Diagnosis not present

## 2021-05-07 DIAGNOSIS — Z872 Personal history of diseases of the skin and subcutaneous tissue: Secondary | ICD-10-CM | POA: Diagnosis not present

## 2021-05-07 DIAGNOSIS — L578 Other skin changes due to chronic exposure to nonionizing radiation: Secondary | ICD-10-CM | POA: Diagnosis not present

## 2021-05-07 DIAGNOSIS — L918 Other hypertrophic disorders of the skin: Secondary | ICD-10-CM | POA: Diagnosis not present

## 2021-05-07 DIAGNOSIS — L2089 Other atopic dermatitis: Secondary | ICD-10-CM | POA: Diagnosis not present

## 2021-06-05 DIAGNOSIS — L2089 Other atopic dermatitis: Secondary | ICD-10-CM | POA: Diagnosis not present

## 2021-08-26 DIAGNOSIS — I10 Essential (primary) hypertension: Secondary | ICD-10-CM | POA: Diagnosis not present

## 2021-08-26 DIAGNOSIS — E782 Mixed hyperlipidemia: Secondary | ICD-10-CM | POA: Diagnosis not present

## 2021-08-26 DIAGNOSIS — I2510 Atherosclerotic heart disease of native coronary artery without angina pectoris: Secondary | ICD-10-CM | POA: Diagnosis not present

## 2021-08-26 DIAGNOSIS — E119 Type 2 diabetes mellitus without complications: Secondary | ICD-10-CM | POA: Diagnosis not present

## 2021-08-26 DIAGNOSIS — G4733 Obstructive sleep apnea (adult) (pediatric): Secondary | ICD-10-CM | POA: Diagnosis not present

## 2021-08-28 IMAGING — US US ABDOMINAL AORTA SCREENING AAA
1 series · 14 of 15 positions shown · non-contrast
Comparison: None.

CLINICAL DATA: History of hypertension.

EXAM:
US ABDOMINAL AORTA MEDICARE SCREENING
TECHNIQUE: Ultrasound examination of the abdominal aorta was performed as a
screening evaluation for abdominal aortic aneurysm.

[Series 1: us abdominal aorta screening aaa · 14 of 15 slices shown]
[im 1/15]
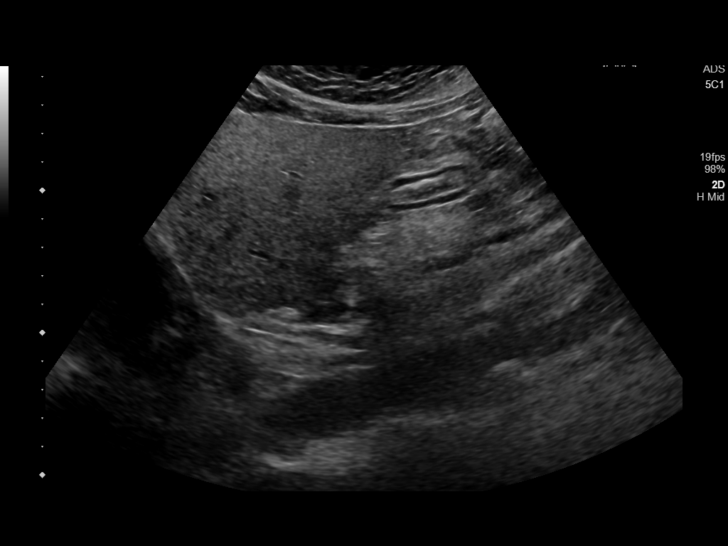
[im 2/15]
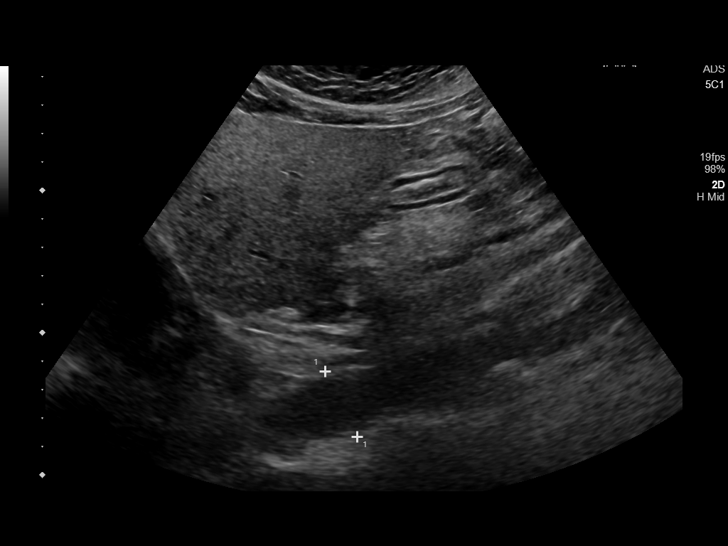
[im 3/15]
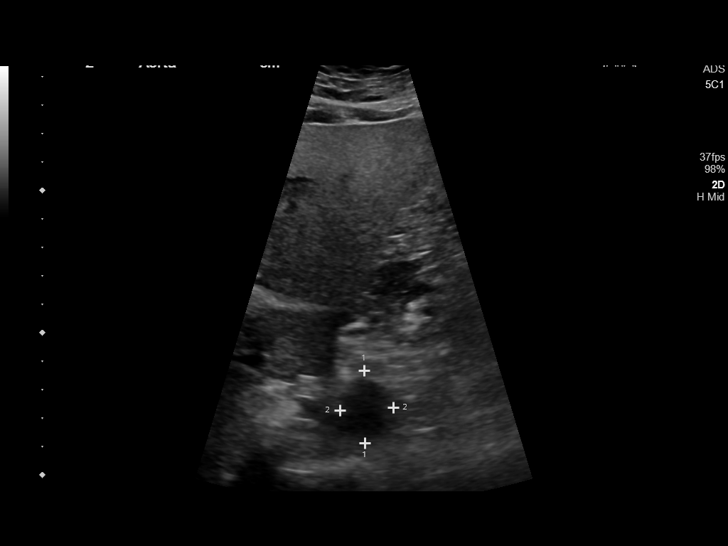
[im 4/15]
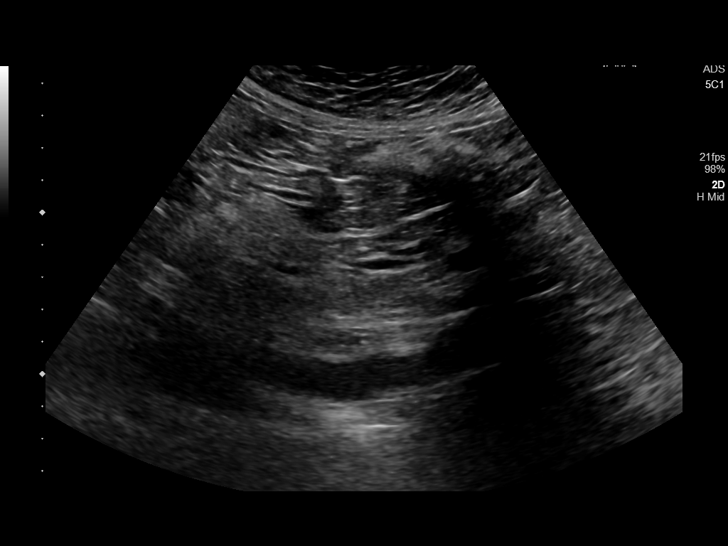
[im 5/15]
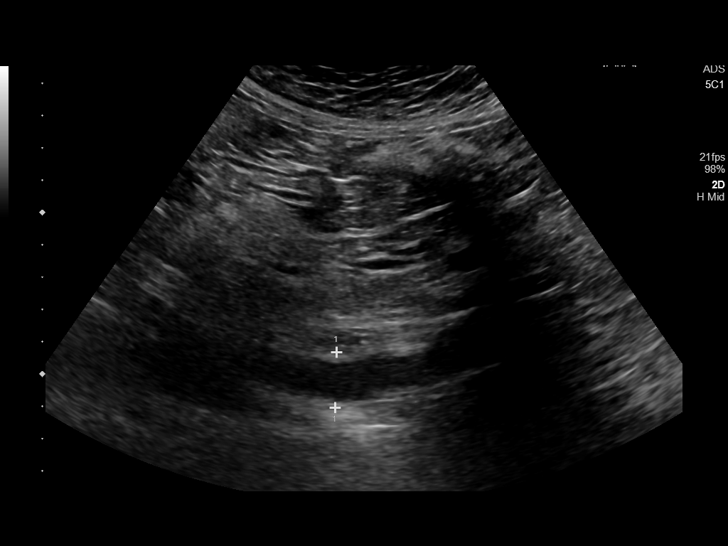
[im 6/15]
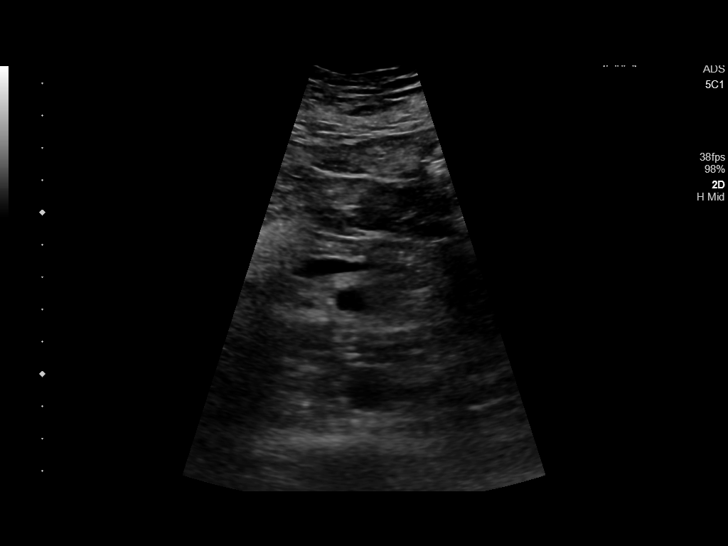
[im 7/15]
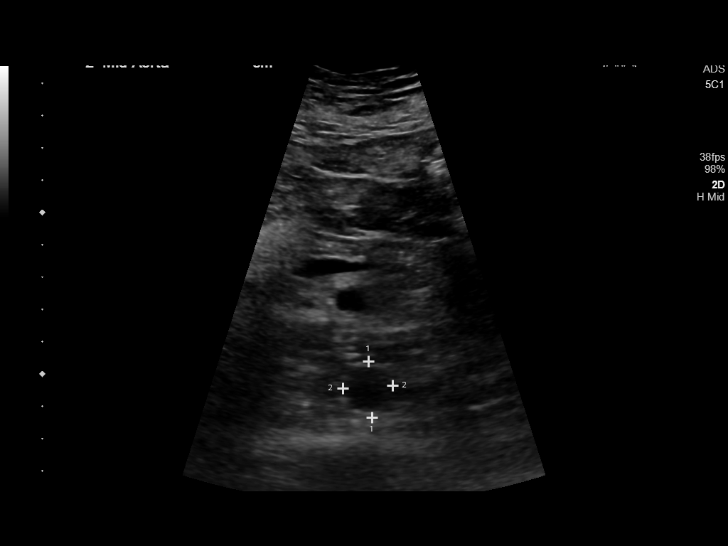
[im 9/15]
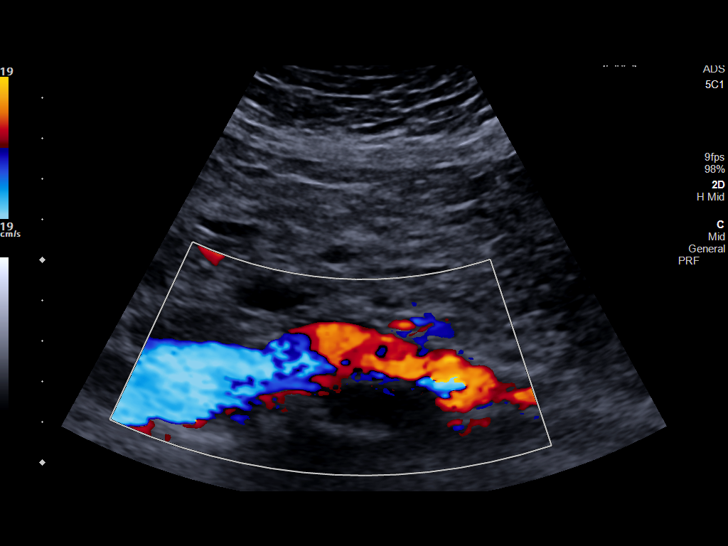
[im 10/15]
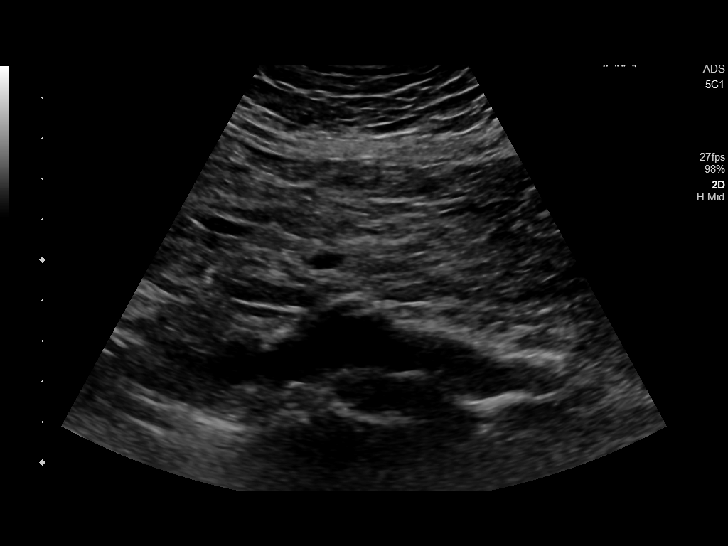
[im 11/15]
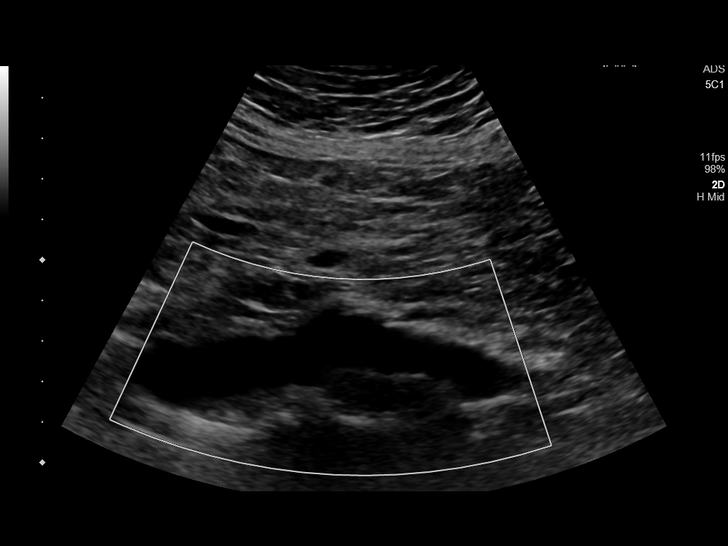
[im 12/15]
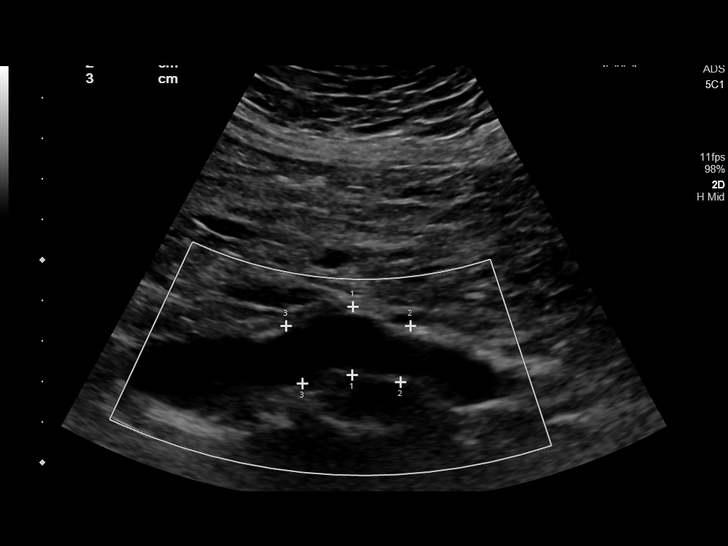
[im 13/15]
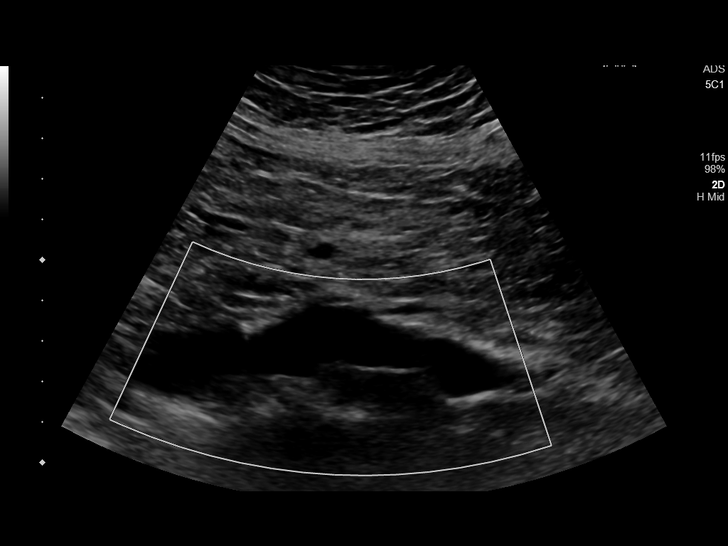
[im 14/15]
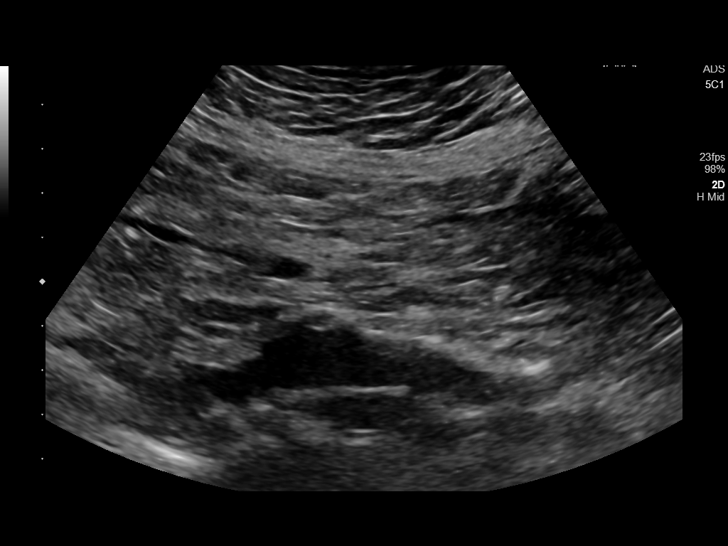
[im 15/15]
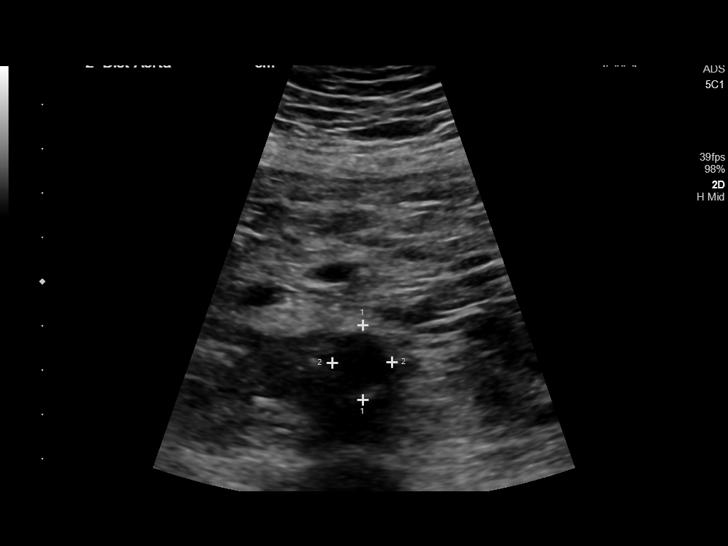

[14 of 15 positions shown; findings below may reference images not displayed]

FINDINGS: Abdominal aortic measurements as follows:

Proximal:  2.5 cm

Mid:  1.7 cm

Distal:  1.7 cm
IMPRESSION: Negative for aneurysm.

## 2021-09-16 DIAGNOSIS — Z87442 Personal history of urinary calculi: Secondary | ICD-10-CM | POA: Diagnosis not present

## 2021-09-16 DIAGNOSIS — I10 Essential (primary) hypertension: Secondary | ICD-10-CM | POA: Diagnosis not present

## 2021-09-16 DIAGNOSIS — R7989 Other specified abnormal findings of blood chemistry: Secondary | ICD-10-CM | POA: Diagnosis not present

## 2021-09-16 DIAGNOSIS — E119 Type 2 diabetes mellitus without complications: Secondary | ICD-10-CM | POA: Diagnosis not present

## 2021-09-16 DIAGNOSIS — E782 Mixed hyperlipidemia: Secondary | ICD-10-CM | POA: Diagnosis not present

## 2021-09-16 DIAGNOSIS — G4733 Obstructive sleep apnea (adult) (pediatric): Secondary | ICD-10-CM | POA: Diagnosis not present

## 2021-09-16 DIAGNOSIS — L301 Dyshidrosis [pompholyx]: Secondary | ICD-10-CM | POA: Diagnosis not present

## 2021-09-17 DIAGNOSIS — I2510 Atherosclerotic heart disease of native coronary artery without angina pectoris: Secondary | ICD-10-CM | POA: Diagnosis not present

## 2022-03-16 DIAGNOSIS — G4733 Obstructive sleep apnea (adult) (pediatric): Secondary | ICD-10-CM | POA: Diagnosis not present

## 2022-03-16 DIAGNOSIS — I252 Old myocardial infarction: Secondary | ICD-10-CM | POA: Diagnosis not present

## 2022-03-16 DIAGNOSIS — I2510 Atherosclerotic heart disease of native coronary artery without angina pectoris: Secondary | ICD-10-CM | POA: Diagnosis not present

## 2022-03-16 DIAGNOSIS — E782 Mixed hyperlipidemia: Secondary | ICD-10-CM | POA: Diagnosis not present

## 2022-03-16 DIAGNOSIS — Z23 Encounter for immunization: Secondary | ICD-10-CM | POA: Diagnosis not present

## 2022-03-16 DIAGNOSIS — I10 Essential (primary) hypertension: Secondary | ICD-10-CM | POA: Diagnosis not present

## 2022-03-16 DIAGNOSIS — E119 Type 2 diabetes mellitus without complications: Secondary | ICD-10-CM | POA: Diagnosis not present

## 2022-03-31 DIAGNOSIS — I2510 Atherosclerotic heart disease of native coronary artery without angina pectoris: Secondary | ICD-10-CM | POA: Diagnosis not present

## 2022-03-31 DIAGNOSIS — E6609 Other obesity due to excess calories: Secondary | ICD-10-CM | POA: Diagnosis not present

## 2022-03-31 DIAGNOSIS — Z1331 Encounter for screening for depression: Secondary | ICD-10-CM | POA: Diagnosis not present

## 2022-03-31 DIAGNOSIS — Z87442 Personal history of urinary calculi: Secondary | ICD-10-CM | POA: Diagnosis not present

## 2022-03-31 DIAGNOSIS — I252 Old myocardial infarction: Secondary | ICD-10-CM | POA: Diagnosis not present

## 2022-03-31 DIAGNOSIS — Z0000 Encounter for general adult medical examination without abnormal findings: Secondary | ICD-10-CM | POA: Diagnosis not present

## 2022-03-31 DIAGNOSIS — I10 Essential (primary) hypertension: Secondary | ICD-10-CM | POA: Diagnosis not present

## 2022-03-31 DIAGNOSIS — Z1211 Encounter for screening for malignant neoplasm of colon: Secondary | ICD-10-CM | POA: Diagnosis not present

## 2022-03-31 DIAGNOSIS — E119 Type 2 diabetes mellitus without complications: Secondary | ICD-10-CM | POA: Diagnosis not present

## 2022-03-31 DIAGNOSIS — Z1330 Encounter for screening examination for mental health and behavioral disorders, unspecified: Secondary | ICD-10-CM | POA: Diagnosis not present

## 2022-03-31 DIAGNOSIS — Z6833 Body mass index (BMI) 33.0-33.9, adult: Secondary | ICD-10-CM | POA: Diagnosis not present

## 2022-03-31 DIAGNOSIS — E782 Mixed hyperlipidemia: Secondary | ICD-10-CM | POA: Diagnosis not present

## 2022-03-31 DIAGNOSIS — G4733 Obstructive sleep apnea (adult) (pediatric): Secondary | ICD-10-CM | POA: Diagnosis not present

## 2022-04-09 DIAGNOSIS — Z1211 Encounter for screening for malignant neoplasm of colon: Secondary | ICD-10-CM | POA: Diagnosis not present

## 2022-05-07 DIAGNOSIS — Z872 Personal history of diseases of the skin and subcutaneous tissue: Secondary | ICD-10-CM | POA: Diagnosis not present

## 2022-05-07 DIAGNOSIS — D044 Carcinoma in situ of skin of scalp and neck: Secondary | ICD-10-CM | POA: Diagnosis not present

## 2022-05-07 DIAGNOSIS — L2089 Other atopic dermatitis: Secondary | ICD-10-CM | POA: Diagnosis not present

## 2022-05-07 DIAGNOSIS — L570 Actinic keratosis: Secondary | ICD-10-CM | POA: Diagnosis not present

## 2022-05-07 DIAGNOSIS — D485 Neoplasm of uncertain behavior of skin: Secondary | ICD-10-CM | POA: Diagnosis not present

## 2022-05-07 DIAGNOSIS — L578 Other skin changes due to chronic exposure to nonionizing radiation: Secondary | ICD-10-CM | POA: Diagnosis not present

## 2022-05-07 DIAGNOSIS — Z808 Family history of malignant neoplasm of other organs or systems: Secondary | ICD-10-CM | POA: Diagnosis not present

## 2022-06-03 DIAGNOSIS — H2513 Age-related nuclear cataract, bilateral: Secondary | ICD-10-CM | POA: Diagnosis not present

## 2022-06-03 DIAGNOSIS — H35033 Hypertensive retinopathy, bilateral: Secondary | ICD-10-CM | POA: Diagnosis not present

## 2022-06-03 DIAGNOSIS — H524 Presbyopia: Secondary | ICD-10-CM | POA: Diagnosis not present

## 2022-06-03 DIAGNOSIS — D3131 Benign neoplasm of right choroid: Secondary | ICD-10-CM | POA: Diagnosis not present

## 2022-07-01 DIAGNOSIS — L988 Other specified disorders of the skin and subcutaneous tissue: Secondary | ICD-10-CM | POA: Diagnosis not present

## 2022-07-01 DIAGNOSIS — C4442 Squamous cell carcinoma of skin of scalp and neck: Secondary | ICD-10-CM | POA: Diagnosis not present

## 2022-09-30 ENCOUNTER — Other Ambulatory Visit: Payer: Self-pay | Admitting: Pediatrics

## 2022-09-30 DIAGNOSIS — Z6833 Body mass index (BMI) 33.0-33.9, adult: Secondary | ICD-10-CM | POA: Diagnosis not present

## 2022-09-30 DIAGNOSIS — I10 Essential (primary) hypertension: Secondary | ICD-10-CM | POA: Diagnosis not present

## 2022-09-30 DIAGNOSIS — E119 Type 2 diabetes mellitus without complications: Secondary | ICD-10-CM

## 2022-09-30 DIAGNOSIS — I2510 Atherosclerotic heart disease of native coronary artery without angina pectoris: Secondary | ICD-10-CM | POA: Diagnosis not present

## 2022-09-30 DIAGNOSIS — I252 Old myocardial infarction: Secondary | ICD-10-CM | POA: Diagnosis not present

## 2022-09-30 DIAGNOSIS — G4733 Obstructive sleep apnea (adult) (pediatric): Secondary | ICD-10-CM | POA: Diagnosis not present

## 2022-09-30 DIAGNOSIS — K7581 Nonalcoholic steatohepatitis (NASH): Secondary | ICD-10-CM

## 2022-09-30 DIAGNOSIS — E6609 Other obesity due to excess calories: Secondary | ICD-10-CM | POA: Diagnosis not present

## 2022-09-30 DIAGNOSIS — R7989 Other specified abnormal findings of blood chemistry: Secondary | ICD-10-CM | POA: Diagnosis not present

## 2022-09-30 DIAGNOSIS — E782 Mixed hyperlipidemia: Secondary | ICD-10-CM | POA: Diagnosis not present

## 2022-10-05 ENCOUNTER — Ambulatory Visit
Admission: RE | Admit: 2022-10-05 | Discharge: 2022-10-05 | Disposition: A | Discharge status: Home / Self Care | Payer: PPO | Source: Ambulatory Visit | Attending: Pediatrics | Admitting: Pediatrics

## 2022-10-05 DIAGNOSIS — E119 Type 2 diabetes mellitus without complications: Secondary | ICD-10-CM | POA: Diagnosis not present

## 2022-10-05 DIAGNOSIS — R7989 Other specified abnormal findings of blood chemistry: Secondary | ICD-10-CM | POA: Insufficient documentation

## 2022-10-05 DIAGNOSIS — K7581 Nonalcoholic steatohepatitis (NASH): Secondary | ICD-10-CM | POA: Insufficient documentation

## 2022-10-13 DIAGNOSIS — G4733 Obstructive sleep apnea (adult) (pediatric): Secondary | ICD-10-CM | POA: Diagnosis not present

## 2022-10-13 DIAGNOSIS — I252 Old myocardial infarction: Secondary | ICD-10-CM | POA: Diagnosis not present

## 2022-10-13 DIAGNOSIS — E119 Type 2 diabetes mellitus without complications: Secondary | ICD-10-CM | POA: Diagnosis not present

## 2022-10-13 DIAGNOSIS — I10 Essential (primary) hypertension: Secondary | ICD-10-CM | POA: Diagnosis not present

## 2022-10-13 DIAGNOSIS — E782 Mixed hyperlipidemia: Secondary | ICD-10-CM | POA: Diagnosis not present

## 2022-10-13 DIAGNOSIS — I2510 Atherosclerotic heart disease of native coronary artery without angina pectoris: Secondary | ICD-10-CM | POA: Diagnosis not present

## 2022-11-11 DIAGNOSIS — W57XXXD Bitten or stung by nonvenomous insect and other nonvenomous arthropods, subsequent encounter: Secondary | ICD-10-CM | POA: Diagnosis not present

## 2022-11-11 DIAGNOSIS — M2550 Pain in unspecified joint: Secondary | ICD-10-CM | POA: Diagnosis not present

## 2022-11-11 DIAGNOSIS — S70361D Insect bite (nonvenomous), right thigh, subsequent encounter: Secondary | ICD-10-CM | POA: Diagnosis not present

## 2022-11-24 DIAGNOSIS — G4733 Obstructive sleep apnea (adult) (pediatric): Secondary | ICD-10-CM | POA: Diagnosis not present

## 2023-02-01 DIAGNOSIS — R0789 Other chest pain: Secondary | ICD-10-CM | POA: Diagnosis not present

## 2023-02-01 DIAGNOSIS — I10 Essential (primary) hypertension: Secondary | ICD-10-CM | POA: Diagnosis not present

## 2023-02-01 DIAGNOSIS — E119 Type 2 diabetes mellitus without complications: Secondary | ICD-10-CM | POA: Diagnosis not present

## 2023-02-01 DIAGNOSIS — I2510 Atherosclerotic heart disease of native coronary artery without angina pectoris: Secondary | ICD-10-CM | POA: Diagnosis not present

## 2023-02-01 DIAGNOSIS — I252 Old myocardial infarction: Secondary | ICD-10-CM | POA: Diagnosis not present

## 2023-02-01 DIAGNOSIS — R079 Chest pain, unspecified: Secondary | ICD-10-CM | POA: Diagnosis not present

## 2023-02-01 DIAGNOSIS — E782 Mixed hyperlipidemia: Secondary | ICD-10-CM | POA: Diagnosis not present

## 2023-02-01 DIAGNOSIS — E6609 Other obesity due to excess calories: Secondary | ICD-10-CM | POA: Diagnosis not present

## 2023-02-01 DIAGNOSIS — Z6833 Body mass index (BMI) 33.0-33.9, adult: Secondary | ICD-10-CM | POA: Diagnosis not present

## 2023-02-16 DIAGNOSIS — I2510 Atherosclerotic heart disease of native coronary artery without angina pectoris: Secondary | ICD-10-CM | POA: Diagnosis not present

## 2023-02-16 DIAGNOSIS — R0789 Other chest pain: Secondary | ICD-10-CM | POA: Diagnosis not present

## 2023-03-02 DIAGNOSIS — I2510 Atherosclerotic heart disease of native coronary artery without angina pectoris: Secondary | ICD-10-CM | POA: Diagnosis not present

## 2023-03-02 DIAGNOSIS — E782 Mixed hyperlipidemia: Secondary | ICD-10-CM | POA: Diagnosis not present

## 2023-03-02 DIAGNOSIS — E119 Type 2 diabetes mellitus without complications: Secondary | ICD-10-CM | POA: Diagnosis not present

## 2023-03-02 DIAGNOSIS — R079 Chest pain, unspecified: Secondary | ICD-10-CM | POA: Diagnosis not present

## 2023-03-02 DIAGNOSIS — G4733 Obstructive sleep apnea (adult) (pediatric): Secondary | ICD-10-CM | POA: Diagnosis not present

## 2023-03-02 DIAGNOSIS — I10 Essential (primary) hypertension: Secondary | ICD-10-CM | POA: Diagnosis not present

## 2023-03-02 DIAGNOSIS — I252 Old myocardial infarction: Secondary | ICD-10-CM | POA: Diagnosis not present

## 2023-03-23 ENCOUNTER — Encounter: Payer: Self-pay | Admitting: Cardiology

## 2023-03-23 ENCOUNTER — Other Ambulatory Visit: Payer: Self-pay

## 2023-03-23 ENCOUNTER — Observation Stay
Admission: RE | Admit: 2023-03-23 | Discharge: 2023-03-24 | Disposition: A | Discharge status: Home / Self Care | Payer: PPO | Attending: Cardiology | Admitting: Cardiology

## 2023-03-23 ENCOUNTER — Encounter
Admission: RE | Disposition: A | Discharge status: Home / Self Care | Payer: Self-pay | Source: Home / Self Care | Attending: Cardiology

## 2023-03-23 DIAGNOSIS — Z7984 Long term (current) use of oral hypoglycemic drugs: Secondary | ICD-10-CM | POA: Diagnosis not present

## 2023-03-23 DIAGNOSIS — R9430 Abnormal result of cardiovascular function study, unspecified: Secondary | ICD-10-CM

## 2023-03-23 DIAGNOSIS — Z87891 Personal history of nicotine dependence: Secondary | ICD-10-CM | POA: Diagnosis not present

## 2023-03-23 DIAGNOSIS — I10 Essential (primary) hypertension: Secondary | ICD-10-CM | POA: Diagnosis not present

## 2023-03-23 DIAGNOSIS — I25119 Atherosclerotic heart disease of native coronary artery with unspecified angina pectoris: Principal | ICD-10-CM | POA: Insufficient documentation

## 2023-03-23 DIAGNOSIS — E119 Type 2 diabetes mellitus without complications: Secondary | ICD-10-CM | POA: Insufficient documentation

## 2023-03-23 DIAGNOSIS — Z7982 Long term (current) use of aspirin: Secondary | ICD-10-CM | POA: Insufficient documentation

## 2023-03-23 DIAGNOSIS — I2510 Atherosclerotic heart disease of native coronary artery without angina pectoris: Principal | ICD-10-CM | POA: Diagnosis present

## 2023-03-23 DIAGNOSIS — Z79899 Other long term (current) drug therapy: Secondary | ICD-10-CM | POA: Insufficient documentation

## 2023-03-23 HISTORY — PX: CORONARY STENT INTERVENTION: CATH118234

## 2023-03-23 HISTORY — PX: LEFT HEART CATH AND CORONARY ANGIOGRAPHY: CATH118249

## 2023-03-23 HISTORY — DX: Unspecified intestinal obstruction, unspecified as to partial versus complete obstruction: K56.609

## 2023-03-23 LAB — POCT ACTIVATED CLOTTING TIME
Activated Clotting Time: 794 seconds
Activated Clotting Time: 311 seconds

## 2023-03-23 LAB — GLUCOSE, CAPILLARY: Glucose-Capillary: 127 mg/dL — ABNORMAL HIGH (ref 70–99)

## 2023-03-23 SURGERY — LEFT HEART CATH AND CORONARY ANGIOGRAPHY
Anesthesia: Moderate Sedation

## 2023-03-23 MED ORDER — FENTANYL CITRATE (PF) 100 MCG/2ML IJ SOLN
INTRAMUSCULAR | Status: DC | PRN
  Administered 2023-03-23: 25 ug via INTRAVENOUS

## 2023-03-23 MED ORDER — ACETAMINOPHEN 325 MG PO TABS: 650.0000 mg | ORAL_TABLET | ORAL | Status: DC | PRN

## 2023-03-23 MED ORDER — IOHEXOL 300 MG/ML  SOLN
INTRAMUSCULAR | Status: DC | PRN
  Administered 2023-03-23: 170 mL

## 2023-03-23 MED ORDER — SODIUM CHLORIDE 0.9 % IV SOLN: 250.0000 mL | INTRAVENOUS | Status: DC | PRN

## 2023-03-23 MED ORDER — HYDRALAZINE HCL 20 MG/ML IJ SOLN
10.0000 mg | INTRAMUSCULAR | Status: AC | PRN
  Administered 2023-03-23: 10 mg via INTRAVENOUS

## 2023-03-23 MED ORDER — LABETALOL HCL 5 MG/ML IV SOLN: 10.0000 mg | INTRAVENOUS | Status: AC | PRN

## 2023-03-23 MED ORDER — LIDOCAINE HCL 1 % IJ SOLN
INTRAMUSCULAR | Status: AC
  Filled 2023-03-23: qty 20

## 2023-03-23 MED ORDER — HEPARIN SODIUM (PORCINE) 1000 UNIT/ML IJ SOLN
INTRAMUSCULAR | Status: AC
  Filled 2023-03-23: qty 10

## 2023-03-23 MED ORDER — PRASUGREL HCL 10 MG PO TABS
ORAL_TABLET | ORAL | Status: AC
  Filled 2023-03-23: qty 6

## 2023-03-23 MED ORDER — ASPIRIN 81 MG PO CHEW
CHEWABLE_TABLET | ORAL | Status: AC
  Filled 2023-03-23: qty 3

## 2023-03-23 MED ORDER — SODIUM CHLORIDE 0.9% FLUSH
3.0000 mL | Freq: Two times a day (BID) | INTRAVENOUS | Status: DC
  Administered 2023-03-23: 3 mL via INTRAVENOUS

## 2023-03-23 MED ORDER — HEPARIN (PORCINE) IN NACL 2000-0.9 UNIT/L-% IV SOLN
INTRAVENOUS | Status: DC | PRN
  Administered 2023-03-23: 1000 mL

## 2023-03-23 MED ORDER — ONDANSETRON HCL 4 MG/2ML IJ SOLN: 4.0000 mg | Freq: Four times a day (QID) | INTRAMUSCULAR | Status: DC | PRN

## 2023-03-23 MED ORDER — ASPIRIN 81 MG PO CHEW
81.0000 mg | CHEWABLE_TABLET | ORAL | Status: AC
  Administered 2023-03-23: 81 mg via ORAL

## 2023-03-23 MED ORDER — PRAVASTATIN SODIUM 40 MG PO TABS
80.0000 mg | ORAL_TABLET | Freq: Every day | ORAL | Status: DC
  Administered 2023-03-23: 80 mg via ORAL
  Filled 2023-03-23 (×2): qty 2

## 2023-03-23 MED ORDER — HEPARIN SODIUM (PORCINE) 1000 UNIT/ML IJ SOLN
INTRAMUSCULAR | Status: DC | PRN
  Administered 2023-03-23: 7000 [IU] via INTRAVENOUS
  Administered 2023-03-23: 4500 [IU] via INTRAVENOUS

## 2023-03-23 MED ORDER — FENTANYL CITRATE (PF) 100 MCG/2ML IJ SOLN
INTRAMUSCULAR | Status: AC
  Filled 2023-03-23: qty 2

## 2023-03-23 MED ORDER — ISOSORBIDE MONONITRATE ER 30 MG PO TB24
30.0000 mg | ORAL_TABLET | Freq: Every day | ORAL | Status: DC
  Administered 2023-03-23 – 2023-03-24 (×2): 30 mg via ORAL
  Filled 2023-03-23 (×2): qty 1

## 2023-03-23 MED ORDER — MIDAZOLAM HCL 2 MG/2ML IJ SOLN
INTRAMUSCULAR | Status: AC
  Filled 2023-03-23: qty 2

## 2023-03-23 MED ORDER — VERAPAMIL HCL 2.5 MG/ML IV SOLN
INTRAVENOUS | Status: DC | PRN
  Administered 2023-03-23: 2.5 mg via INTRA_ARTERIAL

## 2023-03-23 MED ORDER — HEPARIN (PORCINE) IN NACL 1000-0.9 UT/500ML-% IV SOLN
INTRAVENOUS | Status: AC
  Filled 2023-03-23: qty 1000

## 2023-03-23 MED ORDER — ASPIRIN 81 MG PO CHEW
CHEWABLE_TABLET | ORAL | Status: AC
  Filled 2023-03-23: qty 1

## 2023-03-23 MED ORDER — VERAPAMIL HCL 2.5 MG/ML IV SOLN
INTRAVENOUS | Status: AC
  Filled 2023-03-23: qty 2

## 2023-03-23 MED ORDER — SODIUM CHLORIDE 0.9% FLUSH: 3.0000 mL | Freq: Two times a day (BID) | INTRAVENOUS | Status: DC

## 2023-03-23 MED ORDER — LOSARTAN POTASSIUM 50 MG PO TABS
100.0000 mg | ORAL_TABLET | Freq: Every day | ORAL | Status: DC
  Administered 2023-03-23 – 2023-03-24 (×2): 100 mg via ORAL
  Filled 2023-03-23 (×2): qty 2

## 2023-03-23 MED ORDER — METOPROLOL SUCCINATE ER 50 MG PO TB24
50.0000 mg | ORAL_TABLET | Freq: Every day | ORAL | Status: DC
  Administered 2023-03-24: 50 mg via ORAL
  Filled 2023-03-23: qty 1

## 2023-03-23 MED ORDER — PRASUGREL HCL 10 MG PO TABS
ORAL_TABLET | ORAL | Status: DC | PRN
  Administered 2023-03-23: 60 mg via ORAL

## 2023-03-23 MED ORDER — SODIUM CHLORIDE 0.9% FLUSH: 3.0000 mL | INTRAVENOUS | Status: DC | PRN

## 2023-03-23 MED ORDER — ASPIRIN 81 MG PO CHEW
81.0000 mg | CHEWABLE_TABLET | Freq: Every day | ORAL | Status: DC
  Administered 2023-03-24: 81 mg via ORAL
  Filled 2023-03-23: qty 1

## 2023-03-23 MED ORDER — HYDRALAZINE HCL 20 MG/ML IJ SOLN
INTRAMUSCULAR | Status: AC
  Filled 2023-03-23: qty 1

## 2023-03-23 MED ORDER — SODIUM CHLORIDE 0.9 % WEIGHT BASED INFUSION: 1.0000 mL/kg/h | INTRAVENOUS | Status: AC

## 2023-03-23 MED ORDER — LIDOCAINE HCL (PF) 1 % IJ SOLN
INTRAMUSCULAR | Status: DC | PRN
  Administered 2023-03-23: 2 mL

## 2023-03-23 MED ORDER — ASPIRIN 81 MG PO CHEW
CHEWABLE_TABLET | ORAL | Status: DC | PRN
  Administered 2023-03-23: 243 mg via ORAL

## 2023-03-23 MED ORDER — PRASUGREL HCL 10 MG PO TABS
10.0000 mg | ORAL_TABLET | Freq: Every day | ORAL | Status: DC
  Administered 2023-03-24: 10 mg via ORAL
  Filled 2023-03-23: qty 1

## 2023-03-23 MED ORDER — SODIUM CHLORIDE 0.9 % WEIGHT BASED INFUSION: 1.0000 mL/kg/h | INTRAVENOUS | Status: DC

## 2023-03-23 MED ORDER — SODIUM CHLORIDE 0.9 % WEIGHT BASED INFUSION
3.0000 mL/kg/h | INTRAVENOUS | Status: DC
  Administered 2023-03-23: 3 mL/kg/h via INTRAVENOUS

## 2023-03-23 MED ORDER — MIDAZOLAM HCL 2 MG/2ML IJ SOLN
INTRAMUSCULAR | Status: DC | PRN
  Administered 2023-03-23: 1 mg via INTRAVENOUS

## 2023-03-23 SURGICAL SUPPLY — 20 items
BALLN TREK RX 2.5X15 (BALLOONS) ×2
BALLN ~~LOC~~ EUPHORA RX 4.0X15 (BALLOONS) ×2
BALLOON TREK RX 2.5X15 (BALLOONS) IMPLANT
BALLOON ~~LOC~~ EUPHORA RX 4.0X15 (BALLOONS) IMPLANT
CATH 5FR JL3.5 JR4 ANG PIG MP (CATHETERS) IMPLANT
CATH VISTA GUIDE 6FR JR4 (CATHETERS) IMPLANT
DEVICE RAD TR BAND REGULAR (VASCULAR PRODUCTS) IMPLANT
DRAPE BRACHIAL (DRAPES) IMPLANT
GLIDESHEATH SLEND SS 6F .021 (SHEATH) IMPLANT
GUIDEWIRE INQWIRE 1.5J.035X260 (WIRE) IMPLANT
INQWIRE 1.5J .035X260CM (WIRE) ×2
KIT ENCORE 26 ADVANTAGE (KITS) IMPLANT
KIT SYRINGE INJ CVI SPIKEX1 (MISCELLANEOUS) IMPLANT
PACK CARDIAC CATH (CUSTOM PROCEDURE TRAY) ×3 IMPLANT
PROTECTION STATION PRESSURIZED (MISCELLANEOUS) ×2
SET ATX-X65L (MISCELLANEOUS) IMPLANT
STATION PROTECTION PRESSURIZED (MISCELLANEOUS) IMPLANT
STENT ONYX FRONTIER 3.5X22 (Permanent Stent) IMPLANT
TUBING CIL FLEX 10 FLL-RA (TUBING) IMPLANT
WIRE ASAHI PROWATER 180CM (WIRE) IMPLANT

## 2023-03-23 NOTE — Plan of Care (Signed)
Problem: Activity: Goal: Ability to return to baseline activity level will improve Outcome: Progressing   Problem: Cardiovascular: Goal: Ability to achieve and maintain adequate cardiovascular perfusion will improve Outcome: Progressing Goal: Vascular access site(s) Level 0-1 will be maintained Outcome: Progressing

## 2023-03-24 ENCOUNTER — Encounter: Payer: Self-pay | Admitting: Cardiology

## 2023-03-24 DIAGNOSIS — Z9861 Coronary angioplasty status: Secondary | ICD-10-CM | POA: Diagnosis not present

## 2023-03-24 DIAGNOSIS — I25119 Atherosclerotic heart disease of native coronary artery with unspecified angina pectoris: Secondary | ICD-10-CM | POA: Diagnosis not present

## 2023-03-24 LAB — BASIC METABOLIC PANEL
Anion gap: 8 (ref 5–15)
BUN: 12 mg/dL (ref 8–23)
CO2: 23 mmol/L (ref 22–32)
Calcium: 8.6 mg/dL — ABNORMAL LOW (ref 8.9–10.3)
Chloride: 106 mmol/L (ref 98–111)
Creatinine, Ser: 0.84 mg/dL (ref 0.61–1.24)
GFR, Estimated: >60 mL/min (ref >60)
Glucose, Bld: 128 mg/dL — ABNORMAL HIGH (ref 70–99)
Potassium: 3.6 mmol/L (ref 3.5–5.1)
Sodium: 137 mmol/L (ref 135–145)

## 2023-03-24 LAB — CBC
HCT: 40.7 % (ref 39.0–52.0)
Hemoglobin: 13.5 g/dL (ref 13.0–17.0)
MCH: 29.8 pg (ref 26.0–34.0)
MCHC: 33.2 g/dL (ref 30.0–36.0)
MCV: 89.8 fL (ref 80.0–100.0)
Platelets: 226 10*3/uL (ref 150–400)
RBC: 4.53 MIL/uL (ref 4.22–5.81)
RDW: 14.1 % (ref 11.5–15.5)
WBC: 6.6 10*3/uL (ref 4.0–10.5)
nRBC: 0 % (ref 0.0–0.2)

## 2023-03-24 MED ORDER — PRASUGREL HCL 10 MG PO TABS: 10.0000 mg | ORAL_TABLET | Freq: Every day | ORAL | 0 refills | Status: AC

## 2023-03-24 NOTE — Plan of Care (Signed)
  Problem: Education: Goal: Understanding of CV disease, CV risk reduction, and recovery process will improve Outcome: Progressing   Problem: Activity: Goal: Ability to return to baseline activity level will improve Outcome: Progressing   Problem: Clinical Measurements: Goal: Respiratory complications will improve Outcome: Adequate for Discharge Goal: Cardiovascular complication will be avoided Outcome: Adequate for Discharge

## 2023-03-24 NOTE — Progress Notes (Signed)
Transition of Care [facility]) - Inpatient Brief Assessment   Patient Details  Name: Geoffrey Branch MRN: [id number] Date of Birth: May 25, 1951  [facility] of [facility] CM/SW Contact:    Truddie Hidden, RN Phone Number: 03/24/2023, 9:16 AM   Clinical Narrative:  TOC continuing to follow patient's progress throughout discharge planning.   Transition of Care Asessment: Insurance and Status: Insurance coverage has been reviewed Patient has primary care physician: Yes Home environment has been reviewed: Return to home Prior level of function:: independent Prior/Current Home Services: No current home services Social Determinants of Health Reivew: SDOH reviewed no interventions necessary Readmission risk has been reviewed: Yes Transition of care needs: no transition of care needs at this time

## 2023-03-24 NOTE — Discharge Summary (Signed)
Discharge Summary       Patient ID: Geoffrey Branch MRN: [id number] DOB/AGE: 1951-03-13 72 y.o.  Admit date: 03/23/2023 Discharge date: 03/24/2023  Primary Discharge Diagnosis coronary artery disease with angina Secondary Discharge Diagnosis s/p coronary stent placement  Significant Diagnostic Studies: left heart catheterization  Consults: none  Hospital Course: The patient was brought to the cardiac cath lab and underwent left heart catheterization and coronary angiography with Dr. Darrold Junker on 03/23/2023. The patient tolerated with procedure well without complications.  The patient received 3.5 x 22 mm Onyx Frontier stent to the . On 03/24/2023 the right wrist access site was examined and found to be without significant erythema, tenderness to palpation, or apparent aneurysm. Hospital course was overall uneventful, on day of discharge the patient was ambulatory and eager to go home.  Discussed cardiac rehab and new prescription medications in detail. The patient was given aftercare instructions and ER return precautions. Will arrange for follow up in office in 1 week, or sooner if needed.     Discharge Exam: Blood pressure (!) 146/89, pulse 68, temperature 98.4 F (36.9 C), resp. rate 18, height 5\' 7"  (1.702 m), weight 94.8 kg, SpO2 95%.    General: Well appearing male, well nourished, in no acute distress sitting upright in bedside chair.  HEENT:  Normocephalic and atraumatic. Neck:   No JVD.  Lungs: Normal respiratory effort on room air.  Clear to ascultation bilaterally. Heart: HRRR . Normal S1 and S2 without gallops or murmurs.  Abdomen: non-distended appearing.  Msk: Normal strength and tone for age. Extremities: No peripheral edema. R radial access site without bleeding, significant tenderness to palpation, apparent aneurysm, or significant ecchymosis. Covered with clean gauze and tegaderm dressing.  Neuro: Alert and oriented x3 Psych:  calm and cooperative.   Labs:   Lab  Results  Component Value Date   WBC 6.6 03/24/2023   HGB 13.5 03/24/2023   HCT 40.7 03/24/2023   MCV 89.8 03/24/2023   PLT 226 03/24/2023    Recent Labs  Lab 03/24/23 0332  NA 137  K 3.6  CL 106  CO2 23  BUN 12  CREATININE 0.84  CALCIUM 8.6*  GLUCOSE 128*      Radiology: None EKG: NSR rate 64 bpm, stable T wave inversions in inferior and lateral leads  FOLLOW UP PLANS AND APPOINTMENTS Discharge Instructions     AMB Referral to Cardiac Rehabilitation - Phase II   Complete by: As directed    Diagnosis: Coronary Stents   After initial evaluation and assessments completed: Virtual Based Care may be provided alone or in conjunction with Phase 2 Cardiac Rehab based on patient barriers.: Yes   Intensive Cardiac Rehabilitation (ICR) [facility] location only OR Traditional Cardiac Rehabilitation (TCR) *If criteria for ICR are not met will enroll in TCR [facility] only): Yes      Allergies as of 03/24/2023       Reactions   Bee Venom Swelling   Maple Flavor Rash, Other (See Comments)   Wasp Venom Other (See Comments), Rash        Medication List     STOP taking these medications    albuterol 108 (90 Base) MCG/ACT inhaler Commonly known as: VENTOLIN HFA   clopidogrel 75 MG tablet Commonly known as: PLAVIX   docusate sodium 100 MG capsule Commonly known as: COLACE   EPINEPHrine 0.3 mg/0.3 mL Soaj injection Commonly known as: EPI-PEN   glipiZIDE 5 MG tablet Commonly known as: GLUCOTROL   levofloxacin 500  MG tablet Commonly known as: Levaquin   metoprolol tartrate 50 MG tablet Commonly known as: LOPRESSOR   Nucynta 50 MG tablet Generic drug: tapentadol   ondansetron 4 MG tablet Commonly known as: ZOFRAN   ondansetron 8 MG disintegrating tablet Commonly known as: Zofran ODT   oxycodone 5 MG capsule Commonly known as: OXY-IR   promethazine 25 MG tablet Commonly known as: PHENERGAN   tamsulosin 0.4 MG Caps capsule Commonly known as: FLOMAX   tapentadol 50 MG  tablet Commonly known as: NUCYNTA   umeclidinium-vilanterol 62.5-25 MCG/INH Aepb Commonly known as: ANORO ELLIPTA       TAKE these medications    aspirin EC 81 MG tablet Take 81 mg by mouth daily.   cetirizine 10 MG chewable tablet Commonly known as: ZYRTEC Chew 10 mg by mouth daily.   isosorbide mononitrate 30 MG 24 hr tablet Commonly known as: IMDUR Take 30 mg by mouth daily.   losartan 100 MG tablet Commonly known as: COZAAR Take 100 mg by mouth daily. What changed: Another medication with the same name was removed. Continue taking this medication, and follow the directions you see here.   metFORMIN 500 MG 24 hr tablet Commonly known as: GLUCOPHAGE-XR Take 1,000 mg by mouth daily.   metoprolol succinate 50 MG 24 hr tablet Commonly known as: TOPROL-XL Take 50 mg by mouth daily.   nitroGLYCERIN 0.4 MG SL tablet Commonly known as: NITROSTAT Place under the tongue.  Place 1 tablet (0.4 mg total) under the tongue every 5 (five) minutes as needed for Chest pain May take up to 3 doses. What changed: Another medication with the same name was removed. Continue taking this medication, and follow the directions you see here.   prasugrel 10 MG Tabs tablet Commonly known as: EFFIENT Take 1 tablet (10 mg total) by mouth daily.   pravastatin 80 MG tablet Commonly known as: PRAVACHOL Take 80 mg by mouth at bedtime. What changed: Another medication with the same name was removed. Continue taking this medication, and follow the directions you see here.        Follow-up Information     Paraschos, Alexander, MD. Go in 2 week(s).   Specialty: Cardiology Why: Appointment scheduled for 10/16 with Dr. Gilmer Mor information: [address] [facility] [place] [address] [phone]                 PLEASE BRING ALL MEDICATIONS WITH YOU TO FOLLOW UP APPOINTMENTS  Time spent with patient: 40 Signed:  Gale Journey  PA-C 03/24/2023, 8:21 AM [facility] Cardiology

## 2023-03-25 LAB — LIPOPROTEIN A (LPA): Lipoprotein (a): 14.2 nmol/L (ref <75.0)

## 2023-04-21 DIAGNOSIS — Z0000 Encounter for general adult medical examination without abnormal findings: Secondary | ICD-10-CM | POA: Diagnosis not present

## 2023-04-21 DIAGNOSIS — E119 Type 2 diabetes mellitus without complications: Secondary | ICD-10-CM | POA: Diagnosis not present

## 2023-04-21 DIAGNOSIS — E6609 Other obesity due to excess calories: Secondary | ICD-10-CM | POA: Diagnosis not present

## 2023-04-21 DIAGNOSIS — E66811 Obesity, class 1: Secondary | ICD-10-CM | POA: Diagnosis not present

## 2023-04-21 DIAGNOSIS — K824 Cholesterolosis of gallbladder: Secondary | ICD-10-CM | POA: Diagnosis not present

## 2023-04-21 DIAGNOSIS — G4733 Obstructive sleep apnea (adult) (pediatric): Secondary | ICD-10-CM | POA: Diagnosis not present

## 2023-04-21 DIAGNOSIS — Z1330 Encounter for screening examination for mental health and behavioral disorders, unspecified: Secondary | ICD-10-CM | POA: Diagnosis not present

## 2023-04-21 DIAGNOSIS — Z87442 Personal history of urinary calculi: Secondary | ICD-10-CM | POA: Diagnosis not present

## 2023-04-21 DIAGNOSIS — K7581 Nonalcoholic steatohepatitis (NASH): Secondary | ICD-10-CM | POA: Diagnosis not present

## 2023-04-21 DIAGNOSIS — E782 Mixed hyperlipidemia: Secondary | ICD-10-CM | POA: Diagnosis not present

## 2023-04-21 DIAGNOSIS — Z955 Presence of coronary angioplasty implant and graft: Secondary | ICD-10-CM | POA: Diagnosis not present

## 2023-04-21 DIAGNOSIS — Z1331 Encounter for screening for depression: Secondary | ICD-10-CM | POA: Diagnosis not present

## 2023-04-21 DIAGNOSIS — I252 Old myocardial infarction: Secondary | ICD-10-CM | POA: Diagnosis not present

## 2023-04-21 DIAGNOSIS — Z1211 Encounter for screening for malignant neoplasm of colon: Secondary | ICD-10-CM | POA: Diagnosis not present

## 2023-04-21 DIAGNOSIS — I2510 Atherosclerotic heart disease of native coronary artery without angina pectoris: Secondary | ICD-10-CM | POA: Diagnosis not present

## 2023-04-21 DIAGNOSIS — I10 Essential (primary) hypertension: Secondary | ICD-10-CM | POA: Diagnosis not present

## 2023-05-11 DIAGNOSIS — Z859 Personal history of malignant neoplasm, unspecified: Secondary | ICD-10-CM | POA: Diagnosis not present

## 2023-05-11 DIAGNOSIS — L2089 Other atopic dermatitis: Secondary | ICD-10-CM | POA: Diagnosis not present

## 2023-05-11 DIAGNOSIS — Z872 Personal history of diseases of the skin and subcutaneous tissue: Secondary | ICD-10-CM | POA: Diagnosis not present

## 2023-05-11 DIAGNOSIS — D485 Neoplasm of uncertain behavior of skin: Secondary | ICD-10-CM | POA: Diagnosis not present

## 2023-05-11 DIAGNOSIS — Z808 Family history of malignant neoplasm of other organs or systems: Secondary | ICD-10-CM | POA: Diagnosis not present

## 2023-05-11 DIAGNOSIS — L570 Actinic keratosis: Secondary | ICD-10-CM | POA: Diagnosis not present

## 2023-05-11 DIAGNOSIS — L578 Other skin changes due to chronic exposure to nonionizing radiation: Secondary | ICD-10-CM | POA: Diagnosis not present

## 2023-05-21 DIAGNOSIS — G4733 Obstructive sleep apnea (adult) (pediatric): Secondary | ICD-10-CM | POA: Diagnosis not present

## 2023-05-21 DIAGNOSIS — E782 Mixed hyperlipidemia: Secondary | ICD-10-CM | POA: Diagnosis not present

## 2023-05-21 DIAGNOSIS — Z955 Presence of coronary angioplasty implant and graft: Secondary | ICD-10-CM | POA: Diagnosis not present

## 2023-05-21 DIAGNOSIS — E119 Type 2 diabetes mellitus without complications: Secondary | ICD-10-CM | POA: Diagnosis not present

## 2023-05-21 DIAGNOSIS — I2510 Atherosclerotic heart disease of native coronary artery without angina pectoris: Secondary | ICD-10-CM | POA: Diagnosis not present

## 2023-05-21 DIAGNOSIS — I252 Old myocardial infarction: Secondary | ICD-10-CM | POA: Diagnosis not present

## 2023-05-21 DIAGNOSIS — I10 Essential (primary) hypertension: Secondary | ICD-10-CM | POA: Diagnosis not present

## 2023-05-25 DIAGNOSIS — Z1211 Encounter for screening for malignant neoplasm of colon: Secondary | ICD-10-CM | POA: Diagnosis not present

## 2023-06-09 DIAGNOSIS — L570 Actinic keratosis: Secondary | ICD-10-CM | POA: Diagnosis not present
